# Patient Record
Sex: Female | Born: 1960 | Race: White | Hispanic: No | Marital: Married | Smoking: Former smoker
Health system: Southern US, Community
[De-identification: ages and names within clinical notes are randomized; demographics above are authoritative.]

## PROBLEM LIST (undated history)

## (undated) DIAGNOSIS — J432 Centrilobular emphysema: Secondary | ICD-10-CM

## (undated) DIAGNOSIS — R519 Headache, unspecified: Secondary | ICD-10-CM

## (undated) DIAGNOSIS — Z803 Family history of malignant neoplasm of breast: Secondary | ICD-10-CM

## (undated) DIAGNOSIS — J439 Emphysema, unspecified: Secondary | ICD-10-CM

## (undated) DIAGNOSIS — J45909 Unspecified asthma, uncomplicated: Secondary | ICD-10-CM

## (undated) DIAGNOSIS — M1990 Unspecified osteoarthritis, unspecified site: Secondary | ICD-10-CM

## (undated) DIAGNOSIS — G8929 Other chronic pain: Secondary | ICD-10-CM

## (undated) DIAGNOSIS — H539 Unspecified visual disturbance: Secondary | ICD-10-CM

## (undated) DIAGNOSIS — Z972 Presence of dental prosthetic device (complete) (partial): Secondary | ICD-10-CM

## (undated) DIAGNOSIS — N3946 Mixed incontinence: Secondary | ICD-10-CM

## (undated) DIAGNOSIS — Z8709 Personal history of other diseases of the respiratory system: Secondary | ICD-10-CM

## (undated) DIAGNOSIS — Z973 Presence of spectacles and contact lenses: Secondary | ICD-10-CM

## (undated) DIAGNOSIS — E785 Hyperlipidemia, unspecified: Secondary | ICD-10-CM

## (undated) DIAGNOSIS — J939 Pneumothorax, unspecified: Secondary | ICD-10-CM

## (undated) DIAGNOSIS — N393 Stress incontinence (female) (male): Secondary | ICD-10-CM

## (undated) HISTORY — DX: Unspecified osteoarthritis, unspecified site: M19.90

## (undated) HISTORY — DX: Mixed incontinence: N39.46

## (undated) HISTORY — DX: Unspecified visual disturbance: H53.9

## (undated) HISTORY — PX: ABDOMINAL HYSTERECTOMY: SHX81

## (undated) HISTORY — DX: Unspecified asthma, uncomplicated: J45.909

## (undated) HISTORY — PX: OTHER SURGICAL HISTORY: SHX169

## (undated) HISTORY — PX: LUNG SURGERY: SHX703

---

## 1986-09-16 DIAGNOSIS — Z8709 Personal history of other diseases of the respiratory system: Secondary | ICD-10-CM

## 1986-09-16 HISTORY — DX: Personal history of other diseases of the respiratory system: Z87.09

## 1998-05-02 ENCOUNTER — Ambulatory Visit (HOSPITAL_COMMUNITY): Admission: RE | Admit: 1998-05-02 | Discharge: 1998-05-02 | Payer: Self-pay

## 1999-09-17 HISTORY — PX: LAPAROSCOPIC ASSISTED VAGINAL HYSTERECTOMY: SHX5398

## 2004-05-21 ENCOUNTER — Other Ambulatory Visit: Payer: Self-pay

## 2004-07-12 ENCOUNTER — Emergency Department: Payer: Self-pay | Admitting: Internal Medicine

## 2006-02-17 ENCOUNTER — Emergency Department: Payer: Self-pay | Admitting: Emergency Medicine

## 2007-02-02 ENCOUNTER — Other Ambulatory Visit: Payer: Self-pay

## 2007-02-02 ENCOUNTER — Emergency Department: Payer: Self-pay | Admitting: Emergency Medicine

## 2009-10-26 ENCOUNTER — Ambulatory Visit: Payer: Self-pay | Admitting: Internal Medicine

## 2009-10-31 ENCOUNTER — Emergency Department: Payer: Self-pay | Admitting: Emergency Medicine

## 2010-05-14 ENCOUNTER — Observation Stay: Payer: Self-pay | Admitting: Internal Medicine

## 2011-03-25 ENCOUNTER — Inpatient Hospital Stay: Payer: Self-pay | Admitting: Internal Medicine

## 2011-09-17 HISTORY — PX: ESOPHAGOGASTRODUODENOSCOPY: SHX1529

## 2012-06-30 ENCOUNTER — Ambulatory Visit: Payer: Self-pay | Admitting: Internal Medicine

## 2012-07-14 ENCOUNTER — Ambulatory Visit: Payer: Self-pay | Admitting: General Surgery

## 2012-07-15 LAB — PATHOLOGY REPORT

## 2012-07-17 ENCOUNTER — Ambulatory Visit: Payer: Self-pay | Admitting: General Surgery

## 2012-08-11 ENCOUNTER — Ambulatory Visit: Payer: Self-pay | Admitting: Gastroenterology

## 2015-05-22 ENCOUNTER — Encounter: Payer: Self-pay | Admitting: Emergency Medicine

## 2015-05-22 ENCOUNTER — Emergency Department: Payer: BLUE CROSS/BLUE SHIELD

## 2015-05-22 ENCOUNTER — Emergency Department
Admission: EM | Admit: 2015-05-22 | Discharge: 2015-05-22 | Disposition: A | Discharge status: Home / Self Care | Payer: BLUE CROSS/BLUE SHIELD | Attending: Emergency Medicine | Admitting: Emergency Medicine

## 2015-05-22 DIAGNOSIS — R0789 Other chest pain: Secondary | ICD-10-CM | POA: Insufficient documentation

## 2015-05-22 DIAGNOSIS — R0602 Shortness of breath: Secondary | ICD-10-CM | POA: Diagnosis present

## 2015-05-22 DIAGNOSIS — R079 Chest pain, unspecified: Secondary | ICD-10-CM

## 2015-05-22 HISTORY — DX: Pneumothorax, unspecified: J93.9

## 2015-05-22 HISTORY — DX: Emphysema, unspecified: J43.9

## 2015-05-22 LAB — CBC WITH DIFFERENTIAL/PLATELET
BASOS ABS: 0 10*3/uL (ref 0–0.1)
Basophils Relative: 1 %
Eosinophils Absolute: 0.2 10*3/uL (ref 0–0.7)
Eosinophils Relative: 2 %
HEMATOCRIT: 40.5 % (ref 35.0–47.0)
Hemoglobin: 13.7 g/dL (ref 12.0–16.0)
Lymphocytes Relative: 22 %
Lymphs Abs: 1.6 10*3/uL (ref 1.0–3.6)
MCH: 29.8 pg (ref 26.0–34.0)
MCHC: 34 g/dL (ref 32.0–36.0)
MCV: 87.8 fL (ref 80.0–100.0)
Monocytes Absolute: 0.5 10*3/uL (ref 0.2–0.9)
Monocytes Relative: 7 %
NEUTROS ABS: 5 10*3/uL (ref 1.4–6.5)
NEUTROS PCT: 68 %
Platelets: 258 10*3/uL (ref 150–440)
RBC: 4.61 MIL/uL (ref 3.80–5.20)
RDW: 12.8 % (ref 11.5–14.5)
WBC: 7.3 10*3/uL (ref 3.6–11.0)

## 2015-05-22 LAB — COMPREHENSIVE METABOLIC PANEL
ALK PHOS: 39 U/L (ref 38–126)
ALT: 13 U/L — ABNORMAL LOW (ref 14–54)
AST: 16 U/L (ref 15–41)
Albumin: 4.1 g/dL (ref 3.5–5.0)
Anion gap: 8 (ref 5–15)
BILIRUBIN TOTAL: 0.9 mg/dL (ref 0.3–1.2)
BUN: 18 mg/dL (ref 6–20)
CALCIUM: 9.4 mg/dL (ref 8.9–10.3)
CO2: 23 mmol/L (ref 22–32)
Chloride: 106 mmol/L (ref 101–111)
Creatinine, Ser: 0.9 mg/dL (ref 0.44–1.00)
GFR calc Af Amer: >60 mL/min (ref >60)
GFR calc non Af Amer: >60 mL/min (ref >60)
Glucose, Bld: 109 mg/dL — ABNORMAL HIGH (ref 65–99)
POTASSIUM: 4 mmol/L (ref 3.5–5.1)
SODIUM: 137 mmol/L (ref 135–145)
TOTAL PROTEIN: 7.3 g/dL (ref 6.5–8.1)

## 2015-05-22 LAB — TROPONIN I

## 2015-05-22 MED ORDER — KETOROLAC TROMETHAMINE 30 MG/ML IJ SOLN
30.0000 mg | Freq: Once | INTRAMUSCULAR | Status: AC
  Administered 2015-05-22: 30 mg via INTRAVENOUS
  Filled 2015-05-22: qty 1

## 2015-05-22 MED ORDER — LORAZEPAM 1 MG PO TABS: 1.0000 mg | ORAL_TABLET | Freq: Three times a day (TID) | ORAL | Status: AC | PRN

## 2015-05-22 MED ORDER — HYDROMORPHONE HCL 2 MG PO TABS: 2.0000 mg | ORAL_TABLET | Freq: Four times a day (QID) | ORAL | Status: AC | PRN

## 2015-05-22 MED ORDER — IOHEXOL 350 MG/ML SOLN
100.0000 mL | Freq: Once | INTRAVENOUS | Status: AC | PRN
  Administered 2015-05-22: 100 mL via INTRAVENOUS

## 2015-05-22 NOTE — ED Notes (Signed)
Reports sob and lightheaded.  States she has blebs.  No resp distress at this time.

## 2015-05-22 NOTE — Discharge Instructions (Signed)
Chest Pain (Nonspecific) °It is often hard to give a specific diagnosis for the cause of chest pain. There is always a chance that your pain could be related to something serious, such as a heart attack or a blood clot in the lungs. You need to follow up with your health care provider for further evaluation. °CAUSES  °· Heartburn. °· Pneumonia or bronchitis. °· Anxiety or stress. °· Inflammation around your heart (pericarditis) or lung (pleuritis or pleurisy). °· A blood clot in the lung. °· A collapsed lung (pneumothorax). It can develop suddenly on its own (spontaneous pneumothorax) or from trauma to the chest. °· Shingles infection (herpes zoster virus). °The chest wall is composed of bones, muscles, and cartilage. Any of these can be the source of the pain. °· The bones can be bruised by injury. °· The muscles or cartilage can be strained by coughing or overwork. °· The cartilage can be affected by inflammation and become sore (costochondritis). °DIAGNOSIS  °Lab tests or other studies may be needed to find the cause of your pain. Your health care provider may have you take a test called an ambulatory electrocardiogram (ECG). An ECG records your heartbeat patterns over a 24-hour period. You may also have other tests, such as: °· Transthoracic echocardiogram (TTE). During echocardiography, sound waves are used to evaluate how blood flows through your heart. °· Transesophageal echocardiogram (TEE). °· Cardiac monitoring. This allows your health care provider to monitor your heart rate and rhythm in real time. °· Holter monitor. This is a portable device that records your heartbeat and can help diagnose heart arrhythmias. It allows your health care provider to track your heart activity for several days, if needed. °· Stress tests by exercise or by giving medicine that makes the heart beat faster. °TREATMENT  °· Treatment depends on what may be causing your chest pain. Treatment may include: °· Acid blockers for  heartburn. °· Anti-inflammatory medicine. °· Pain medicine for inflammatory conditions. °· Antibiotics if an infection is present. °· You may be advised to change lifestyle habits. This includes stopping smoking and avoiding alcohol, caffeine, and chocolate. °· You may be advised to keep your head raised (elevated) when sleeping. This reduces the chance of acid going backward from your stomach into your esophagus. °Most of the time, nonspecific chest pain will improve within 2-3 days with rest and mild pain medicine.  °HOME CARE INSTRUCTIONS  °· If antibiotics were prescribed, take them as directed. Finish them even if you start to feel better. °· For the next few days, avoid physical activities that bring on chest pain. Continue physical activities as directed. °· Do not use any tobacco products, including cigarettes, chewing tobacco, or electronic cigarettes. °· Avoid drinking alcohol. °· Only take medicine as directed by your health care provider. °· Follow your health care provider's suggestions for further testing if your chest pain does not go away. °· Keep any follow-up appointments you made. If you do not go to an appointment, you could develop lasting (chronic) problems with pain. If there is any problem keeping an appointment, call to reschedule. °SEEK MEDICAL CARE IF:  °· Your chest pain does not go away, even after treatment. °· You have a rash with blisters on your chest. °· You have a fever. °SEEK IMMEDIATE MEDICAL CARE IF:  °· You have increased chest pain or pain that spreads to your arm, neck, jaw, back, or abdomen. °· You have shortness of breath. °· You have an increasing cough, or you cough   up blood.  You have severe back or abdominal pain.  You feel nauseous or vomit.  You have severe weakness.  You faint.  You have chills. This is an emergency. Do not wait to see if the pain will go away. Get medical help at once. Call your local emergency services (911 in [place] Do not drive  yourself to the hospital. MAKE SURE YOU:   Understand these instructions.  Will watch your condition.  Will get help right away if you are not doing well or get worse. Document Released: 06/12/2005 Document Revised: 09/07/2013 Document Reviewed: 04/07/2008 [facility] Patient Information 2015 [place], [place]. This information is not intended to replace advice given to you by your health care provider. Make sure you discuss any questions you have with your health care provider.  Chest Wall Pain Chest wall pain is pain in or around the bones and muscles of your chest. It may take up to 6 weeks to get better. It may take longer if you must stay physically active in your work and activities.  CAUSES  Chest wall pain may happen on its own. However, it may be caused by:  A viral illness like the flu.  Injury.  Coughing.  Exercise.  Arthritis.  Fibromyalgia.  Shingles. HOME CARE INSTRUCTIONS   Avoid overtiring physical activity. Try not to strain or perform activities that cause pain. This includes any activities using your chest or your abdominal and side muscles, especially if heavy weights are used.  Put ice on the sore area.  Put ice in a plastic bag.  Place a towel between your skin and the bag.  Leave the ice on for 15-20 minutes per hour while awake for the first 2 days.  Only take over-the-counter or prescription medicines for pain, discomfort, or fever as directed by your caregiver. SEEK IMMEDIATE MEDICAL CARE IF:   Your pain increases, or you are very uncomfortable.  You have a fever.  Your chest pain becomes worse.  You have new, unexplained symptoms.  You have nausea or vomiting.  You feel sweaty or lightheaded.  You have a cough with phlegm (sputum), or you cough up blood. MAKE SURE YOU:   Understand these instructions.  Will watch your condition.  Will get help right away if you are not doing well or get worse. Document Released: 09/02/2005 Document  Revised: 11/25/2011 Document Reviewed: 04/29/2011 [facility] Patient Information 2015 [place], [place]. This information is not intended to replace advice given to you by your health care provider. Make sure you discuss any questions you have with your health care provider.  Chest Wall Pain Chest wall pain is pain in or around the bones and muscles of your chest. It may take up to 6 weeks to get better. It may take longer if you must stay physically active in your work and activities.  CAUSES  Chest wall pain may happen on its own. However, it may be caused by:  A viral illness like the flu.  Injury.  Coughing.  Exercise.  Arthritis.  Fibromyalgia.  Shingles. HOME CARE INSTRUCTIONS   Avoid overtiring physical activity. Try not to strain or perform activities that cause pain. This includes any activities using your chest or your abdominal and side muscles, especially if heavy weights are used.  Put ice on the sore area.  Put ice in a plastic bag.  Place a towel between your skin and the bag.  Leave the ice on for 15-20 minutes per hour while awake for the first 2  days.  Only take over-the-counter or prescription medicines for pain, discomfort, or fever as directed by your caregiver. SEEK IMMEDIATE MEDICAL CARE IF:   Your pain increases, or you are very uncomfortable.  You have a fever.  Your chest pain becomes worse.  You have new, unexplained symptoms.  You have nausea or vomiting.  You feel sweaty or lightheaded.  You have a cough with phlegm (sputum), or you cough up blood. MAKE SURE YOU:   Understand these instructions.  Will watch your condition.  Will get help right away if you are not doing well or get worse. Document Released: 09/02/2005 Document Revised: 11/25/2011 Document Reviewed: 04/29/2011 [facility] Patient Information 2015 [place], [place]. This information is not intended to replace advice given to you by your health care provider. Make sure you  discuss any questions you have with your health care provider.  Please return immediately if condition worsens. Please contact her primary physician or the physician you were given for referral. If you have any specialist physicians involved in her treatment and plan please also contact them. Thank you for using [facility] [facility]. Please call your primary physician in the morning for follow-up as soon as possible

## 2015-05-22 NOTE — ED Notes (Signed)
Pt remains in waiting room where RN can see pt.  No resp distress.  Xray here to take pt for scan.

## 2015-05-22 NOTE — ED Notes (Signed)
Pt informed to return if any life threatening symptoms occur.  

## 2015-05-22 NOTE — ED Provider Notes (Signed)
Time Seen: ----------------------------------------- 8:50 AM on 05/22/2015 -----------------------------------------   I have reviewed the triage notes  Chief Complaint: Shortness of Breath   History of Present Illness: Cassidy Suarez is a 54 y.o. female who presents with left-sided chest discomfort. She states her pain is worse with movement and has difficulty turning to the left. Patient states her pain started on Friday and seemed to be better on Saturday but returned on Sunday. She states is relatively constant she's having a hard time finding a position of comfort. She describes some mild shortness of breath initially especially on Friday when the pain was somewhat more pleuritic in nature. She states she had an adjustment by her chiropractor but otherwise cannot identify any trauma. Patient denies any persistent nausea or vomiting. She denies any leg pain or swelling. She denies any radiation to the arm or jaw area though she states she has had some left-sided neck discomfort. Eyes any focal weakness in either upper or lower extremities. She denies any pulmonary emboli risk factors. She states she's taken 3 Vicodin at home without relief and also has some muscle relaxant medication at home which is also not offered any relief. Past Medical History  Diagnosis Date  . Lung blebs   . Pneumothorax     There are no active problems to display for this patient.   Past Surgical History  Procedure Laterality Date  . C sections      Past Surgical History  Procedure Laterality Date  . C sections      No current outpatient prescriptions on file.  Allergies:  Review of patient's allergies indicates no known allergies.  Family History: History reviewed. No pertinent family history.  Social History: Social History  Substance Use Topics  . Smoking status: Never Smoker   . Smokeless tobacco: None  . Alcohol Use: Yes     Review of Systems:   10 point review of systems was  performed and was otherwise negative:  Constitutional: No fever Eyes: No visual disturbances ENT: No sore throat, ear pain Cardiac: No chest pain Respiratory: No shortness of breath, wheezing, or stridor Abdomen: No abdominal pain, no vomiting, No diarrhea Endocrine: No weight loss, No night sweats Extremities: No peripheral edema, cyanosis Skin: No rashes, easy bruising Neurologic: No focal weakness, trouble with speech or swollowing Urologic: No dysuria, Hematuria, or urinary frequency   Physical Exam:  ED Triage Vitals  Enc Vitals Group     BP 05/22/15 0727 111/72 mmHg     Pulse Rate 05/22/15 0727 78     Resp 05/22/15 0727 20     Temp 05/22/15 0727 98.7 F (37.1 C)     Temp Source 05/22/15 0727 Oral     SpO2 05/22/15 0727 99 %     Weight 05/22/15 0722 152 lb (68.947 kg)     Height 05/22/15 0722  (1.702 m)     Head Cir --      Peak Flow --      Pain Score 05/22/15 0722 0     Pain Loc --      Pain Edu? --      Excl. in GC? --     General: Awake , Alert , and Oriented times 3; GCS 15 Head: Normal cephalic , atraumatic Eyes: Pupils equal , round, reactive to light Nose/Throat: No nasal drainage, patent upper airway without erythema or exudate.  Neck: Supple, Full range of motion, No anterior adenopathy or palpable thyroid masses Lungs: Clear to ascultation  without wheezes , rhonchi, or rales Heart: Regular rate, regular rhythm without murmurs , gallops , or rubs Abdomen: Soft, non tender without rebound, guarding , or rigidity; bowel sounds positive and symmetric in all 4 quadrants. No organomegaly .        Extremities: 2 plus symmetric pulses. No edema, clubbing or cyanosis Neurologic: normal ambulation, Motor symmetric without deficits, sensory intact Skin: warm, dry, no rashes Patient has reproducible pain with pressing on the left scapular area down through the left chest wall region. Palpation here does reproduce the patient's discomfort.  Labs:   All  laboratory work was reviewed including any pertinent negatives or positives listed below:  Labs Reviewed  COMPREHENSIVE METABOLIC PANEL - Abnormal; Notable for the following:    Glucose, Bld 109 (*)    ALT 13 (*)    All other components within normal limits  CBC WITH DIFFERENTIAL/PLATELET  TROPONIN I    EKG:  ED ECG REPORT I, Jennye Moccasin, the attending physician, personally viewed and interpreted this ECG.  Date: 05/22/2015 EKG Time: 737 Rate: 73 Rhythm: normal sinus rhythm QRS Axis: normal Intervals: normal ST/T Wave abnormalities: normal Conduction Disutrbances: none Narrative Interpretation:  Poor R-wave progression noticed in the anterior leads, no acute ischemic changes are noted  Radiology:   EXAM: CHEST 2 VIEW  COMPARISON: 03/25/2011 and prior chest radiographs  FINDINGS: The cardiomediastinal silhouette is unremarkable.  There is no evidence of focal airspace disease, pulmonary edema, suspicious pulmonary nodule/mass, pleural effusion, or pneumothorax. No acute bony abnormalities are identified.  IMPRESSION: No active cardiopulmonary disease.     EXAM: CT ANGIOGRAPHY CHEST WITH CONTRAST  TECHNIQUE: Multidetector CT imaging of the chest was performed using the standard protocol during bolus administration of intravenous contrast. Multiplanar CT image reconstructions and MIPs were obtained to evaluate the vascular anatomy.  CONTRAST: OMNIPAQUE IOHEXOL 350 MG/ML SOLN  COMPARISON: 05/22/2015 and prior chest radiographs. 05/14/2010 chest CT  FINDINGS: This is a technically satisfactory study.  Mediastinum/Nodes: No pulmonary emboli are identified. The heart and great vessels are unremarkable. There is no evidence of thoracic aortic aneurysm or definite dissection. No pericardial effusion or enlarged lymph nodes identified.  Lungs/Pleura: Moderate centrilobular emphysema identified. There is no evidence of pleural effusion, airspace  disease, consolidation, suspicious nodule, mass or endobronchial/endotracheal lesion.  Upper abdomen: Unremarkable  Musculoskeletal: No acute or suspicious abnormalities identified.  Review of the MIP images confirms the above findings.  IMPRESSION: No evidence of acute abnormality -no evidence of pulmonary emboli.  Moderate emphysema.  I personally reviewed the radiologic studies       ED Course:  Differential includes all life-threatening causes for chest pain. This includes but is not exclusive to acute coronary syndrome, aortic dissection, pulmonary embolism, cardiac tamponade, community-acquired pneumonia, pericarditis, musculoskeletal chest wall pain, etc.  Patient received Toradol here in emergency department with no significant relief. Patient states she needs to drive home and instructed her that I can prescribe her stronger pain medication for home but I cannot give her anything here and have her drive. Her pain seems to be chest wall pain possible muscle spasm versus early shingles given the location patient states she has had recent shaking shingles on the anterior surface of her left leg and this would be rather unusual for her to develop in another location that is closest to her previous case. Patient does not appear to have any signs on objectively for pulmonary embolism or pneumothorax.   Assessment:  Acute unspecified chest pain Chest  wall discomfort  Final Clinical Impression:   Final diagnoses:  Chest pain syndrome     Plan:  Patient was advised to return immediately if condition worsens. Patient was advised to follow up with her primary care physician or other specialized physicians involved and in their current assessment. Patient was given a prescription for Dilaudid and Ativan on an outpatient basis. She was given instructions on the medication.            Jennye Moccasin, MD 05/22/15 [phone]

## 2015-06-05 ENCOUNTER — Ambulatory Visit (INDEPENDENT_AMBULATORY_CARE_PROVIDER_SITE_OTHER): Payer: BLUE CROSS/BLUE SHIELD | Admitting: Podiatry

## 2015-06-05 ENCOUNTER — Encounter: Payer: Self-pay | Admitting: Podiatry

## 2015-06-05 VITALS — BP 105/64 | HR 79 | Resp 12

## 2015-06-05 DIAGNOSIS — L603 Nail dystrophy: Secondary | ICD-10-CM

## 2015-06-05 NOTE — Progress Notes (Signed)
   Subjective:    Patient ID: Cassidy Suarez, female    DOB: December 17, 1960, 54 y.o.   MRN: [id number]  HPI she presents today with a several day duration of discoloration to the fibular portion of the hallux nails bilateral. She denies trauma. States that the discoloration was there when she removed her toenail polish.    Review of Systems  Musculoskeletal: Positive for myalgias.  Skin: Positive for color change.       Objective:   Physical Exam: 54 year old female with a chief complaint of discoloration fibular portion of the hallux nails bilateral distress. Vital signs are stable alert and oriented 3. Pulses are palpable. Neurologic sensorium is intact per Semmes-Weinstein monofilament. Deep tendon reflexes are intact. Muscle strength is 5 over 5 dorsiflexion plantar flexors and inverters everters all intrinsic musculature is intact. Orthopedic evaluation joints distal to the ankle range of motion without crepitation. Cutaneous evaluation demonstrates supple well-hydrated cutis discoloration of the hallux nails which appears to be a subungual hematoma along the lateral aspect of the nail. Extends primarily from the posterior lateral to the distal lateral aspect of the nail plate. I expressed to the patient that this appeared to be blood she denied this emphatically.     Assessment & Plan:  Subungual hematoma bilateral hallux. Remainder of the nail plates are normal. No athlete's foot.  Plan: Discussed etiology pathology conservative versus surgical therapies. Took samples of the nails today sent for pathologic evaluation will follow up with her once the pathology reports returned.

## 2015-06-22 ENCOUNTER — Telehealth: Payer: Self-pay | Admitting: *Deleted

## 2015-06-22 NOTE — Telephone Encounter (Signed)
Called pt to let her know that culture was positive for fungus and appointment was needed to discuss treatment.

## 2015-07-02 ENCOUNTER — Ambulatory Visit
Admission: EM | Admit: 2015-07-02 | Discharge: 2015-07-02 | Disposition: A | Discharge status: Home / Self Care | Payer: BLUE CROSS/BLUE SHIELD | Attending: Family Medicine | Admitting: Family Medicine

## 2015-07-02 ENCOUNTER — Encounter: Payer: Self-pay | Admitting: *Deleted

## 2015-07-02 ENCOUNTER — Ambulatory Visit: Payer: BLUE CROSS/BLUE SHIELD

## 2015-07-02 DIAGNOSIS — S9002XA Contusion of left ankle, initial encounter: Secondary | ICD-10-CM | POA: Diagnosis not present

## 2015-07-02 MED ORDER — MUPIROCIN 2 % EX OINT: 1.0000 "application " | TOPICAL_OINTMENT | Freq: Three times a day (TID) | CUTANEOUS | Status: DC

## 2015-07-02 MED ORDER — HYDROCODONE-ACETAMINOPHEN 5-325 MG PO TABS: 1.0000 | ORAL_TABLET | Freq: Four times a day (QID) | ORAL | Status: DC | PRN

## 2015-07-02 NOTE — ED Provider Notes (Signed)
CSN: [id number]     Arrival date & time 07/02/15  1346 History   First MD Initiated Contact with Patient 07/02/15 1458     Chief Complaint  Patient presents with  . Foot Injury   (Consider location/radiation/quality/duration/timing/severity/associated sxs/prior Treatment) HPI   This a 54 year old female accompanied by her husband was stacking rocks in  a wheelbarrow in her yard today, it became unbalanced and tipped over striking her lateral ankle and foot. She sustained a small abrasion to the lateral distal leg. It is very painful to bear weight having cramps up her entire leg.  Past Medical History  Diagnosis Date  . Lung blebs (HCC)   . Pneumothorax   . Arthritis    Past Surgical History  Procedure Laterality Date  . C sections     Family History  Problem Relation Age of Onset  . Cancer Mother   . Diabetes Mother   . Stroke Mother    Social History  Substance Use Topics  . Smoking status: Never Smoker   . Smokeless tobacco: None  . Alcohol Use: Yes   OB History    No data available     Review of Systems  Constitutional: Positive for activity change. Negative for fever, chills and fatigue.  Skin: Positive for color change and wound.  All other systems reviewed and are negative.   Allergies  Review of patient's allergies indicates no known allergies.  Home Medications   Prior to Admission medications   Medication Sig Start Date End Date Taking? Authorizing Provider  cyclobenzaprine (FLEXERIL) 5 MG tablet  05/16/15   Historical Provider, MD  estradiol-norethindrone (ACTIVELLA) 1-0.5 MG per tablet  05/16/15   Historical Provider, MD  etodolac (LODINE) 200 MG capsule Take 200 mg by mouth every 8 (eight) hours.    Historical Provider, MD  HYDROcodone-acetaminophen (NORCO/VICODIN) 5-325 MG tablet Take 1-2 tablets by mouth every 6 (six) hours as needed for severe pain. 07/02/15   Lutricia FeilWilliam P Lala Been, PA-C  LORazepam (ATIVAN) 1 MG tablet Take 1 tablet (1 mg total) by  mouth every 8 (eight) hours as needed (muscle spasm). 05/22/15 05/28/16  Jennye MoccasinBrian S Quigley, MD  mupirocin ointment (BACTROBAN) 2 % Apply 1 application topically 3 (three) times daily. 07/02/15   Lutricia FeilWilliam P Kemonte Ullman, PA-C   Meds Ordered and Administered this Visit  Medications - No data to display  BP 99/71 mmHg  Pulse 79  Temp(Src) 98 F (36.7 C) (Oral)  Resp 18  Ht 5' 7.5" (1.715 m)  Wt 152 lb (68.947 kg)  BMI 23.44 kg/m2  SpO2 100%  LMP  No data found.   Physical Exam  Constitutional: She is oriented to person, place, and time. She appears well-developed and well-nourished.  HENT:  Head: Normocephalic and atraumatic.  Eyes: Pupils are equal, round, and reactive to light.  Neck: Neck supple.  Musculoskeletal: She exhibits tenderness.  His admission of the left ankle shows small abrasion just superior to the lateral malleolus. Is no swelling ecchymosis or erythema present. Complains of even the lightest touch causing pain. Withdraws her foot with any attempt to palpate. Is fairly good range of motion of her ankle with discomfort at the extremes of flexion extension there is good subtalar motion. Is no pain to palpation of the calcaneus and hindfoot midfoot or forefoot.. Most tenderness is along the distal fibula lateral malleolus. Medial malleolus is uninvolved.  Neurological: She is alert and oriented to person, place, and time.  Skin: Skin is warm and dry.  Psychiatric:  She has a normal mood and affect. Her behavior is normal. Judgment and thought content normal.  Nursing note and vitals reviewed.   ED Course  Procedures (including critical care time)  Labs Review Labs Reviewed - No data to display  Imaging Review Dg Ankle Complete Left  07/02/2015  CLINICAL DATA:  54 year old female with left ankle pain after a barrel full of rocks tipped over and landed on the left ankle. EXAM: LEFT ANKLE COMPLETE - 3+ VIEW COMPARISON:  No priors. FINDINGS: Multiple views of the left ankle  demonstrate no acute displaced fracture, subluxation, dislocation, or soft tissue abnormality. IMPRESSION: No acute radiographic abnormality of the left ankle. Electronically Signed   By: Trudie Reed M.D.   On: 07/02/2015 14:55     Visual Acuity Review  Right Eye Distance:   Left Eye Distance:   Bilateral Distance:    Right Eye Near:   Left Eye Near:    Bilateral Near:     Cam walker fitted. Crutches provided.    MDM   1. Contusion, ankle, left, initial encounter    Discharge Medication List as of 07/02/2015  3:24 PM    START taking these medications   Details  HYDROcodone-acetaminophen (NORCO/VICODIN) 5-325 MG tablet Take 1-2 tablets by mouth every 6 (six) hours as needed for severe pain., Starting 07/02/2015, Until Discontinued, Print    mupirocin ointment (BACTROBAN) 2 % Apply 1 application topically 3 (three) times daily., Starting 07/02/2015, Until Discontinued, Print      Plan: 1. Test/x-ray results and diagnosis reviewed with patient 2. rx as per orders; risks, benefits, potential side effects reviewed with patient 3. Recommend supportive treatment with elevation/ice. Rest,crutches PRN.Wash and apply Bactroban UGT to skin tear TID. 4. F/u prn if symptoms worsen or don't improve     Lutricia Feil, PA-C 07/02/15 [id number]

## 2015-07-02 NOTE — ED Notes (Signed)
Working out in her yard moving rocks in a wheel barrel when the wheel barrel fell on the outer aspect of her left ankle and foot. 10/10 pain, positive pulses, skin tear to outer ankle.  She states she is able to bear weight but is very painful.

## 2015-07-02 NOTE — Discharge Instructions (Signed)

## 2015-07-19 ENCOUNTER — Encounter: Payer: Self-pay | Admitting: Podiatry

## 2015-07-19 ENCOUNTER — Ambulatory Visit (INDEPENDENT_AMBULATORY_CARE_PROVIDER_SITE_OTHER): Payer: BLUE CROSS/BLUE SHIELD | Admitting: Podiatry

## 2015-07-19 VITALS — BP 151/96 | HR 83 | Resp 18

## 2015-07-19 DIAGNOSIS — L603 Nail dystrophy: Secondary | ICD-10-CM | POA: Diagnosis not present

## 2015-07-19 DIAGNOSIS — Z79899 Other long term (current) drug therapy: Secondary | ICD-10-CM | POA: Diagnosis not present

## 2015-07-19 MED ORDER — TERBINAFINE HCL 250 MG PO TABS: 250.0000 mg | ORAL_TABLET | Freq: Every day | ORAL | Status: DC

## 2015-07-19 NOTE — Patient Instructions (Signed)

## 2015-07-20 LAB — CBC WITH DIFFERENTIAL/PLATELET
BASOS: 0 %
Basophils Absolute: 0 10*3/uL (ref 0.0–0.2)
EOS (ABSOLUTE): 0.1 10*3/uL (ref 0.0–0.4)
EOS: 1 %
HEMATOCRIT: 39.6 % (ref 34.0–46.6)
Hemoglobin: 13.7 g/dL (ref 11.1–15.9)
IMMATURE GRANS (ABS): 0 10*3/uL (ref 0.0–0.1)
IMMATURE GRANULOCYTES: 0 %
Lymphocytes Absolute: 1.5 10*3/uL (ref 0.7–3.1)
Lymphs: 21 %
MCH: 30.2 pg (ref 26.6–33.0)
MCHC: 34.6 g/dL (ref 31.5–35.7)
MCV: 87 fL (ref 79–97)
MONOS ABS: 0.6 10*3/uL (ref 0.1–0.9)
Monocytes: 9 %
NEUTROS ABS: 4.9 10*3/uL (ref 1.4–7.0)
Neutrophils: 69 %
PLATELETS: 318 10*3/uL (ref 150–379)
RBC: 4.54 x10E6/uL (ref 3.77–5.28)
RDW: 12.8 % (ref 12.3–15.4)
WBC: 7.1 10*3/uL (ref 3.4–10.8)

## 2015-07-20 NOTE — Progress Notes (Signed)
She presents today for follow-up of her fungal culture from her toenails.  Objective: Fungal culture lab results do demonstrate onychomycosis.  Assessment: Onychomycosis.  Plan: We discussed both oral medication and laser therapy. At this point she would like oral medication. We requested liver profile and CBC. We will start her on terbinafine 250 mg tablets #30 one by mouth daily I will follow-up with her in 1 month. Should her blood work on back abnormal we will notify her immediately. We did discuss the possible side effects of this medication. She was instructed to call should any of these arise.  Arbutus Pedodd Hyatt DPM

## 2015-07-21 LAB — HEPATIC FUNCTION PANEL
ALT: 11 IU/L (ref 0–32)
AST: 11 IU/L (ref 0–40)
Albumin: 4.4 g/dL (ref 3.5–5.5)
Alkaline Phosphatase: 40 IU/L (ref 39–117)
BILIRUBIN, DIRECT: 0.11 mg/dL (ref 0.00–0.40)
Bilirubin Total: 0.5 mg/dL (ref 0.0–1.2)
Total Protein: 6.7 g/dL (ref 6.0–8.5)

## 2015-07-24 ENCOUNTER — Telehealth: Payer: Self-pay | Admitting: *Deleted

## 2015-07-24 NOTE — Telephone Encounter (Addendum)
-----   Message from Elinor ParkinsonMax T Hyatt, [place] sent at 07/24/2015  7:50 AM EST ----- Blood work looks good and may continue medication.  Orders called to pt.

## 2015-08-21 ENCOUNTER — Ambulatory Visit (INDEPENDENT_AMBULATORY_CARE_PROVIDER_SITE_OTHER): Payer: BLUE CROSS/BLUE SHIELD | Admitting: Podiatry

## 2015-08-21 ENCOUNTER — Encounter: Payer: Self-pay | Admitting: Podiatry

## 2015-08-21 VITALS — BP 111/76 | HR 79 | Resp 12

## 2015-08-21 DIAGNOSIS — Z79899 Other long term (current) drug therapy: Secondary | ICD-10-CM

## 2015-08-21 MED ORDER — TERBINAFINE HCL 250 MG PO TABS: 250.0000 mg | ORAL_TABLET | Freq: Every day | ORAL | Status: DC

## 2015-08-21 NOTE — Progress Notes (Signed)
She presents today for follow-up of her onychomycosis. She's been treated with Lamisil and denies any complications taking medication. She denies fever chills nausea vomiting muscle aches pains rashes or itching. No change in the nail plates yet she says.  Objective: Onychomycosis.  Assessment: Onychomycosis.  Plan: Long-term use of Lamisil 250 mg tablets 1 by mouth daily 90 days dispensed today. Also dispensed a prescription for blood work consisting of a liver profile I will follow-up with her should this be abnormal otherwise I will follow up with her in 4 months.

## 2015-08-26 LAB — HEPATIC FUNCTION PANEL
ALT: 12 IU/L (ref 0–32)
AST: 10 IU/L (ref 0–40)
Albumin: 4.4 g/dL (ref 3.5–5.5)
Alkaline Phosphatase: 35 IU/L — ABNORMAL LOW (ref 39–117)
Bilirubin Total: 0.3 mg/dL (ref 0.0–1.2)
Bilirubin, Direct: 0.1 mg/dL (ref 0.00–0.40)
Total Protein: 6.8 g/dL (ref 6.0–8.5)

## 2015-08-28 ENCOUNTER — Telehealth: Payer: Self-pay | Admitting: *Deleted

## 2015-08-28 NOTE — Telephone Encounter (Addendum)
-----   Message from Elinor ParkinsonMax T Hyatt, [place] sent at 08/28/2015  8:10 AM EST ----- Blood work is ok and may continue medication.  Left message results are normal continue medications.

## 2015-12-20 ENCOUNTER — Encounter (INDEPENDENT_AMBULATORY_CARE_PROVIDER_SITE_OTHER): Payer: BLUE CROSS/BLUE SHIELD | Admitting: Podiatry

## 2015-12-20 NOTE — Progress Notes (Signed)
This encounter was created in error - please disregard.

## 2016-11-28 ENCOUNTER — Ambulatory Visit: Payer: BLUE CROSS/BLUE SHIELD | Admitting: Advanced Practice Midwife

## 2016-12-06 ENCOUNTER — Telehealth: Payer: Self-pay

## 2016-12-06 NOTE — Telephone Encounter (Signed)
Pt calling stating she is out of her hormones.  She has sched. appt and would like a refill to get her to appt. [facility]. [phone]

## 2016-12-10 MED ORDER — ESTRADIOL-NORETHINDRONE ACET 1-0.5 MG PO TABS: 1.0000 | ORAL_TABLET | Freq: Every day | ORAL | 0 refills | Status: DC

## 2016-12-10 NOTE — Telephone Encounter (Signed)
Refill sent.

## 2016-12-19 ENCOUNTER — Ambulatory Visit: Payer: BLUE CROSS/BLUE SHIELD | Admitting: Advanced Practice Midwife

## 2017-01-30 ENCOUNTER — Ambulatory Visit: Payer: Self-pay | Admitting: Obstetrics and Gynecology

## 2017-04-03 ENCOUNTER — Other Ambulatory Visit: Payer: Self-pay | Admitting: Obstetrics and Gynecology

## 2017-04-22 ENCOUNTER — Telehealth: Payer: Self-pay

## 2017-04-22 MED ORDER — ESTRADIOL-NORETHINDRONE ACET 1-0.5 MG PO TABS: 1.0000 | ORAL_TABLET | Freq: Every day | ORAL | 1 refills | Status: DC

## 2017-04-22 NOTE — Telephone Encounter (Signed)
Pt called triage to request refills on activella. Per chart last refill given in March with zero additional refills. Verified with patient that she has been taking consistently and pt stated that PCP may have prescribed it for her as courtesy. Pt has canceled the last 3 appts (March, April & May) and has scheduled annual exam for 06/17/17 with [facility]. Advised pt that she has not been seen in 2 years and must come to this appt. Will send refills to last only until appt.

## 2017-06-17 ENCOUNTER — Encounter: Payer: Self-pay | Admitting: Obstetrics and Gynecology

## 2017-06-17 ENCOUNTER — Ambulatory Visit (INDEPENDENT_AMBULATORY_CARE_PROVIDER_SITE_OTHER): Payer: 59 | Admitting: Obstetrics and Gynecology

## 2017-06-17 VITALS — BP 120/70 | HR 82 | Ht 67.0 in | Wt 151.0 lb

## 2017-06-17 DIAGNOSIS — Z1231 Encounter for screening mammogram for malignant neoplasm of breast: Secondary | ICD-10-CM

## 2017-06-17 DIAGNOSIS — Z79890 Hormone replacement therapy (postmenopausal): Secondary | ICD-10-CM | POA: Diagnosis not present

## 2017-06-17 DIAGNOSIS — L02416 Cutaneous abscess of left lower limb: Secondary | ICD-10-CM | POA: Diagnosis not present

## 2017-06-17 DIAGNOSIS — Z803 Family history of malignant neoplasm of breast: Secondary | ICD-10-CM

## 2017-06-17 DIAGNOSIS — Z1239 Encounter for other screening for malignant neoplasm of breast: Secondary | ICD-10-CM

## 2017-06-17 DIAGNOSIS — Z01419 Encounter for gynecological examination (general) (routine) without abnormal findings: Secondary | ICD-10-CM | POA: Diagnosis not present

## 2017-06-17 DIAGNOSIS — Z1151 Encounter for screening for human papillomavirus (HPV): Secondary | ICD-10-CM | POA: Diagnosis not present

## 2017-06-17 DIAGNOSIS — Z124 Encounter for screening for malignant neoplasm of cervix: Secondary | ICD-10-CM | POA: Diagnosis not present

## 2017-06-17 MED ORDER — ESTRADIOL-NORETHINDRONE ACET 1-0.5 MG PO TABS: 1.0000 | ORAL_TABLET | Freq: Every day | ORAL | 12 refills | Status: DC

## 2017-06-17 MED ORDER — DOXYCYCLINE HYCLATE 100 MG PO CAPS: 100.0000 mg | ORAL_CAPSULE | Freq: Two times a day (BID) | ORAL | 0 refills | Status: DC

## 2017-06-17 NOTE — Patient Instructions (Signed)
[facility]: [phone]    

## 2017-06-17 NOTE — Progress Notes (Signed)
PCP: Jaclyn Shaggy, MD   Chief Complaint  Patient presents with  . Gynecologic Exam    HPI:      [name] is a 56 y.o. [phone] who LMP was No LMP recorded. Patient has had a hysterectomy., presents today for her annual examination.  Her menses are absent due to hyst for CPP yrs ago. She does not have intermenstrual bleeding.  She does have vasomotor sx. She uses activella with some sx improvement. No side effects. Wants to cont meds.   Sex activity: single partner, contraception - status post hysterectomy. She does have vaginal dryness, uses lubricants.  Last Pap: February 27, 2013  Results were: no abnormalities /neg HPV DNA.  Hx of STDs: none  Last mammogram: July 13, 2015  Results were: normal--routine follow-up in 12 months There is a FH of breast cancer in her mother and mat aunt, genetic testing not indicated. There is no FH of ovarian cancer. The patient does do self-breast exams.  Colonoscopy: colonoscopy 5 years ago without abnormalities. Pt followed by PCP for colonoscopy sched.   Tobacco use: The patient denies current or previous tobacco use. Alcohol use: social drinker Exercise: moderately active  She does get adequate calcium but not Vitamin D in her diet.  She states she was bitten by a spider on her leg over the wknd while doing yard work. She didn't see the spider but the area on her left leg started as a small "bite" mark and has gotten bigger and redder. It is very painful, tender to touch. Spreading of redness has been stable since yesterday. Pt had nausea yesterday, too.    Past Medical History:  Diagnosis Date  . Arthritis   . Lung blebs (HCC)   . Pneumothorax     Past Surgical History:  Procedure Laterality Date  . ABDOMINAL HYSTERECTOMY    . c sections    . CESAREAN SECTION    . LUNG SURGERY      Family History  Problem Relation Age of Onset  . Diabetes Mother   . Stroke Mother   . Breast cancer Mother 11       triple neg  .  Heart disease Mother   . Hypertension Mother   . Heart disease Father   . Hypertension Father   . Breast cancer Maternal Aunt 64  . Lung cancer Cousin     Social History   Social History  . Marital status: Married    Spouse name: N/A  . Number of children: N/A  . Years of education: N/A   Occupational History  . Not on file.   Social History Main Topics  . Smoking status: Never Smoker  . Smokeless tobacco: Never Used  . Alcohol use Yes  . Drug use: No  . Sexual activity: Yes   Other Topics Concern  . Not on file   Social History Narrative  . No narrative on file    No outpatient prescriptions have been marked as taking for the 06/17/17 encounter (Office Visit) with Jaunita Mikels, Ilona Sorrel, PA-C.      ROS:  Review of Systems  Constitutional: Negative for fatigue, fever and unexpected weight change.  Respiratory: Negative for cough, shortness of breath and wheezing.   Cardiovascular: Negative for chest pain, palpitations and leg swelling.  Gastrointestinal: Negative for blood in stool, constipation, diarrhea, nausea and vomiting.  Endocrine: Negative for cold intolerance, heat intolerance and polyuria.  Genitourinary: Negative for dyspareunia, dysuria, flank pain, frequency, genital  sores, hematuria, menstrual problem, pelvic pain, urgency, vaginal bleeding, vaginal discharge and vaginal pain.  Musculoskeletal: Negative for back pain, joint swelling and myalgias.  Skin: Positive for color change and rash.  Neurological: Negative for dizziness, syncope, light-headedness, numbness and headaches.  Hematological: Negative for adenopathy.  Psychiatric/Behavioral: Negative for agitation, confusion, sleep disturbance and suicidal ideas. The patient is not nervous/anxious.      Objective: BP 120/70   Pulse 82   Ht  (1.702 m)   Wt 151 lb (68.5 kg)   BMI 23.65 kg/m    Physical Exam  Constitutional: She is oriented to person, place, and time. She appears  well-developed and well-nourished.  Genitourinary: Vagina normal. There is no rash or tenderness on the right labia. There is no rash or tenderness on the left labia. No erythema or tenderness in the vagina. No vaginal discharge found. Right adnexum does not display mass and does not display tenderness. Left adnexum does not display mass and does not display tenderness.  Genitourinary Comments: UTERUS/CX SURG REM  Neck: Normal range of motion. No thyromegaly present.  Cardiovascular: Normal rate, regular rhythm and normal heart sounds.   No murmur heard. Pulmonary/Chest: Effort normal and breath sounds normal. Right breast exhibits no mass, no nipple discharge, no skin change and no tenderness. Left breast exhibits no mass, no nipple discharge, no skin change and no tenderness.  Abdominal: Soft. There is no tenderness. There is no guarding.  Musculoskeletal: Normal range of motion.  Neurological: She is alert and oriented to person, place, and time. No cranial nerve deficit.  Skin: Lesion noted. There is erythema.     LT UPPER THIGH WITH ~5 INCH AREA OF TENDER ERYTHEMA WITH RUBOR, MOSTLY TENDER AT CENTER SITE OF "BITE"; NO D/C, NO ULCERS  Psychiatric: She has a normal mood and affect. Her behavior is normal.  Vitals reviewed.   Assessment/Plan:  Encounter for annual routine gynecological examination  Screening for breast cancer - Pt to sched mammo. REcommended 3D at [facility] due to FH breast cancer. - Plan: MM SCREENING BREAST TOMO BILATERAL  Family history of breast cancer - Doesn't qualify for cancer genetic testing since mom >41 with age of triple neg breast cancer. Will cont to follow FH. - Plan: MM SCREENING BREAST TOMO BILATERAL  Cervical cancer screening - Plan: IGP, Aptima HPV  Screening for HPV (human papillomavirus) - Plan: IGP, Aptima HPV  Hormone replacement therapy (HRT) - Rx RF activella. F/u prn.  - Plan: estradiol-norethindrone (ACTIVELLA) 1-0.5 MG tablet  Cutaneous  abscess of left lower extremity - Question bite vs abscess. Rx doxy. Warm and cold compresses. F/u prn.  - Plan: doxycycline (VIBRAMYCIN) 100 MG capsule   Meds ordered this encounter  Medications  . doxycycline (VIBRAMYCIN) 100 MG capsule    Sig: Take 1 capsule (100 mg total) by mouth 2 (two) times daily.    Dispense:  14 capsule    Refill:  0  . estradiol-norethindrone (ACTIVELLA) 1-0.5 MG tablet    Sig: Take 1 tablet by mouth daily.    Dispense:  30 tablet    Refill:  12            GYN counsel breast self exam, mammography screening, use and side effects of HRT, menopause, adequate intake of calcium and vitamin D, diet and exercise    F/U  Return in about 1 year (around 06/17/2018).  Regnia Mathwig B. Mcclain Shall, PA-C 06/17/2017 9:37 AM

## 2017-06-19 LAB — IGP, APTIMA HPV
HPV Aptima: NEGATIVE
PAP Smear Comment: 0

## 2017-08-13 IMAGING — CT CT ANGIO CHEST
1 of 2 series · 18 of 30 positions shown · IV contrast (APPLIED)
Comparison: 05/22/2015 and prior chest radiographs. 05/14/2010
chest CT

CLINICAL DATA: 54-year-old female with shortness of breath for 4
days and cough for 2 days.

EXAM:
CT ANGIOGRAPHY CHEST WITH CONTRAST
TECHNIQUE: Multidetector CT imaging of the chest was performed using the
standard protocol during bolus administration of intravenous
contrast. Multiplanar CT image reconstructions and MIPs were
obtained to evaluate the vascular anatomy.
CONTRAST:  100mL OMNIPAQUE IOHEXOL 350 MG/ML SOLN

[Series 5: pe 1.0 thins · axial · 0.60mm/px · z∈[-189,+68]mm · 18 of 291 slices shown]
[im 17/291  lung]
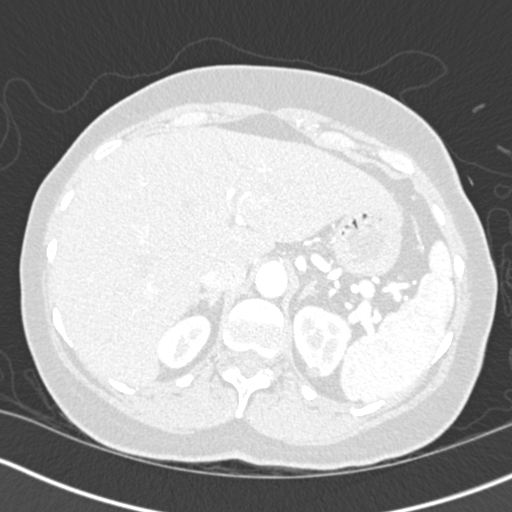
[im 33/291  mediastinal]
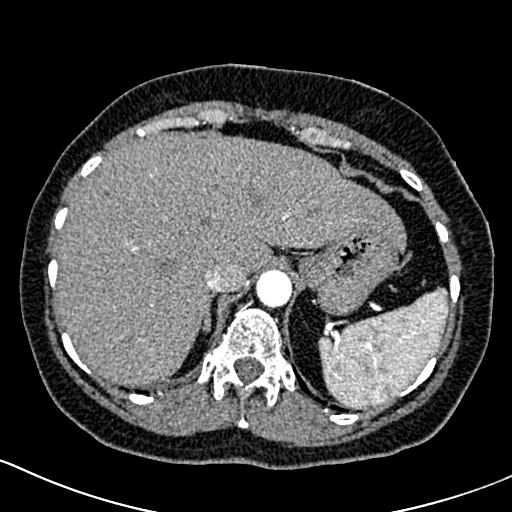
[im 49/291  lung]
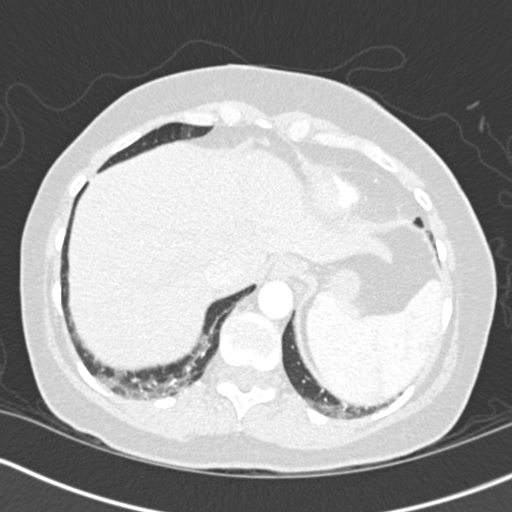
[im 65/291  mediastinal]
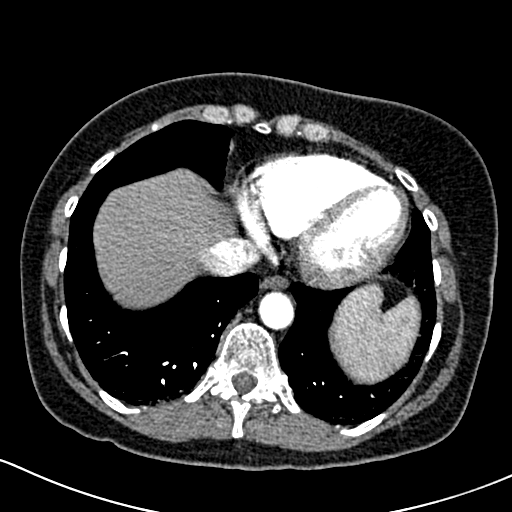
[im 81/291  lung]
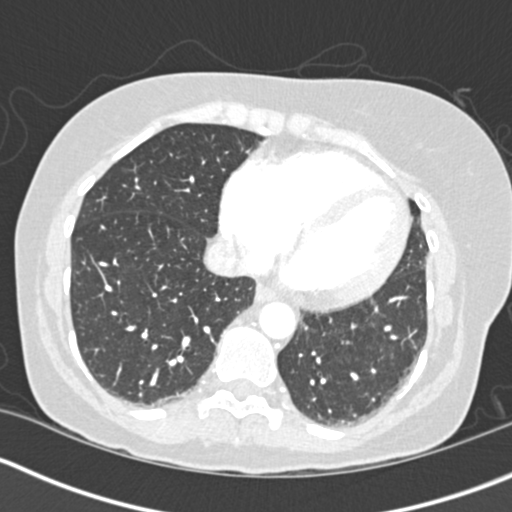
[im 97/291  mediastinal]
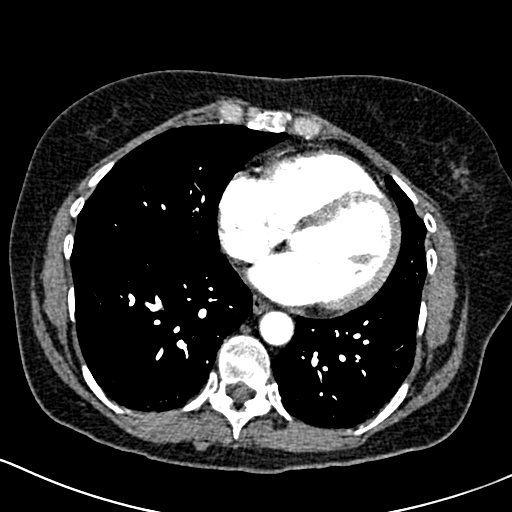
[im 113/291  lung]
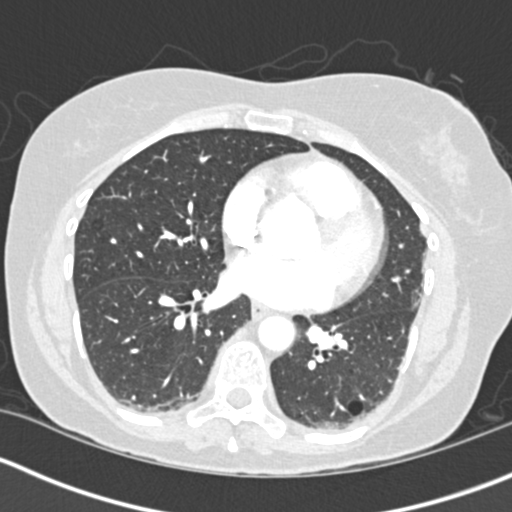
[im 129/291  mediastinal]
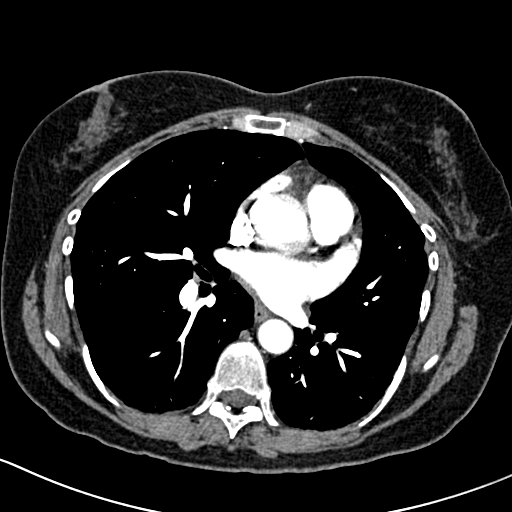
[im 136/291  lung]
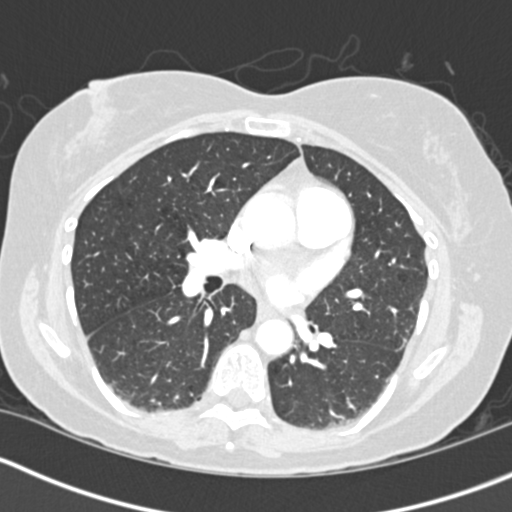
[im 146/291  mediastinal]
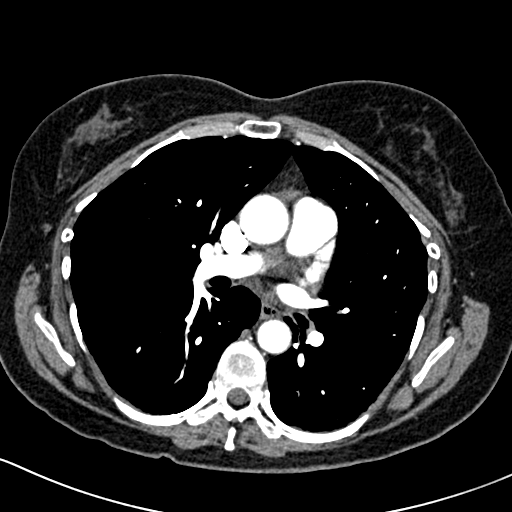
[im 162/291  lung]
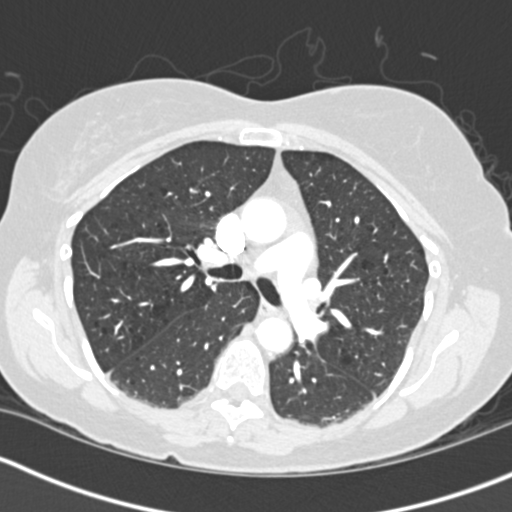
[im 178/291  mediastinal]
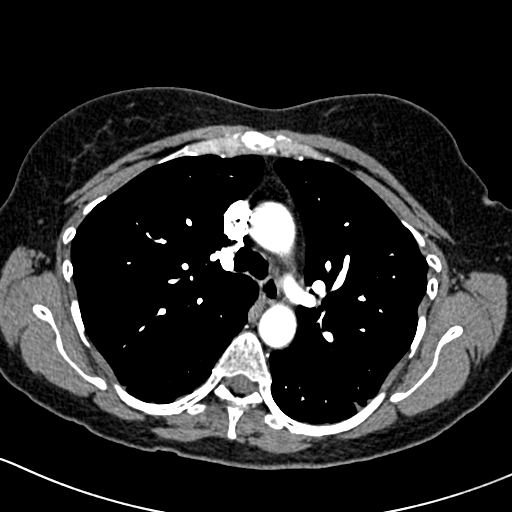
[im 194/291  lung]
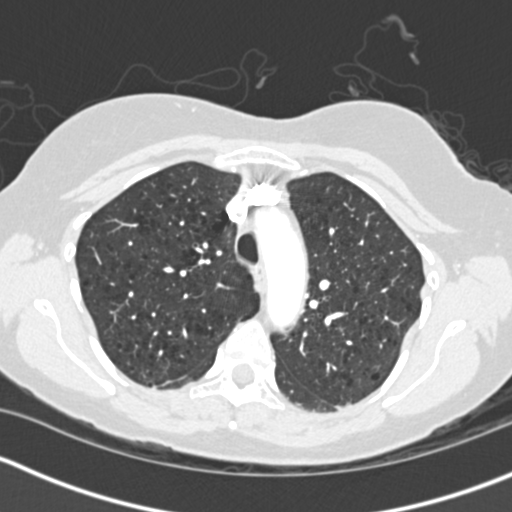
[im 210/291  mediastinal]
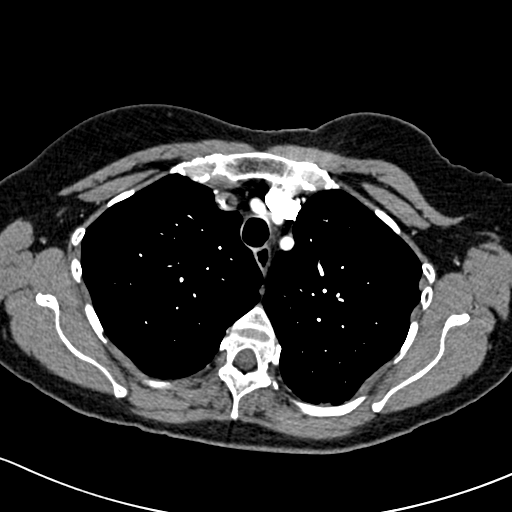
[im 226/291  lung]
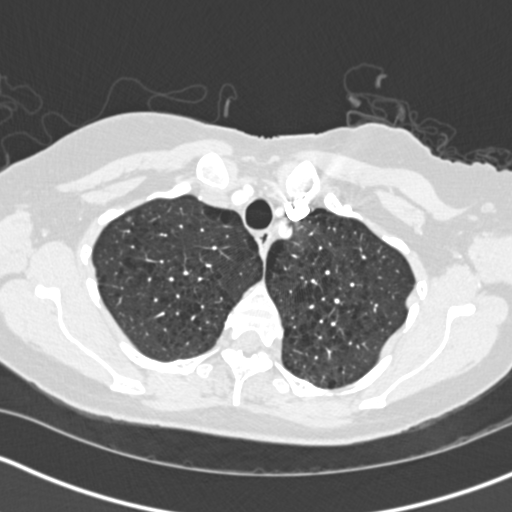
[im 242/291  mediastinal]
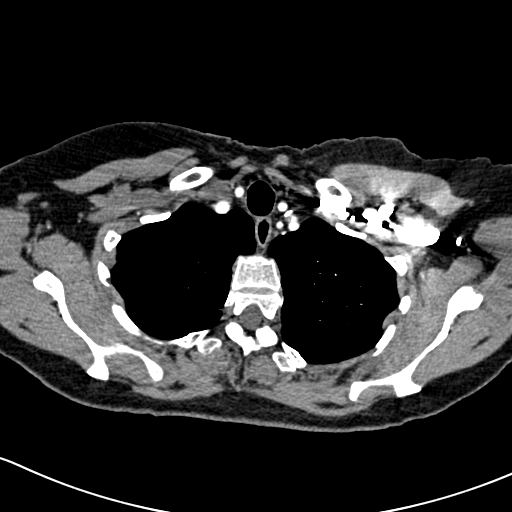
[im 258/291  lung]
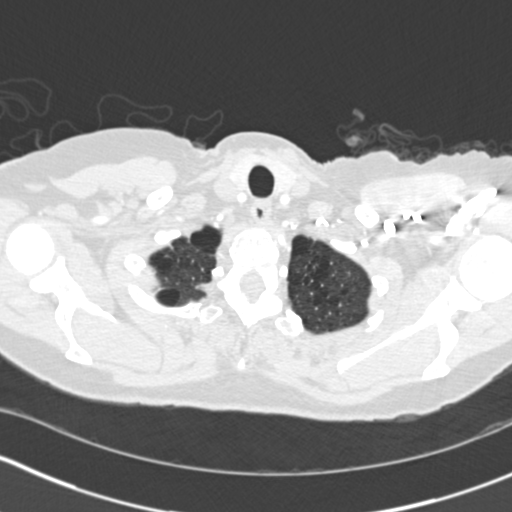
[im 274/291  mediastinal]
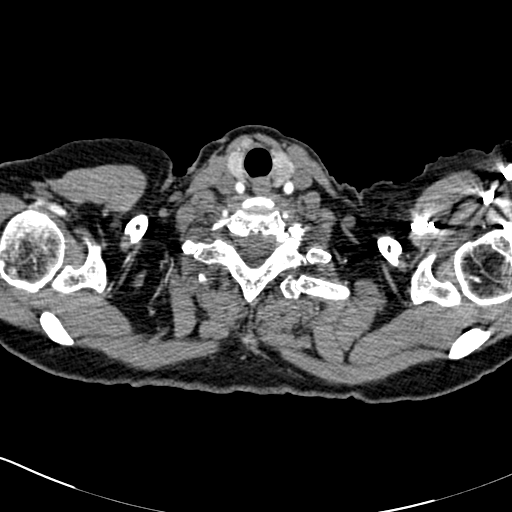

[18 of 30 positions shown; findings below may reference images not displayed]

FINDINGS: This is a technically satisfactory study.

Mediastinum/Nodes: No pulmonary emboli are identified. The heart and
great vessels are unremarkable. There is no evidence of thoracic
aortic aneurysm or definite dissection. No pericardial effusion or
enlarged lymph nodes identified.

Lungs/Pleura: Moderate centrilobular emphysema identified. There is
no evidence of pleural effusion, airspace disease, consolidation,
suspicious nodule, mass or endobronchial/endotracheal lesion.

Upper abdomen: Unremarkable

Musculoskeletal: No acute or suspicious abnormalities identified.

Review of the MIP images confirms the above findings.
IMPRESSION: No evidence of acute abnormality -no evidence of pulmonary emboli.

Moderate emphysema.

## 2018-06-22 ENCOUNTER — Other Ambulatory Visit: Payer: Self-pay | Admitting: Obstetrics and Gynecology

## 2018-06-22 DIAGNOSIS — Z1231 Encounter for screening mammogram for malignant neoplasm of breast: Secondary | ICD-10-CM

## 2018-06-24 ENCOUNTER — Inpatient Hospital Stay: Admission: RE | Admit: 2018-06-24 | Payer: 59 | Source: Ambulatory Visit

## 2018-06-29 ENCOUNTER — Other Ambulatory Visit: Payer: Self-pay | Admitting: Obstetrics and Gynecology

## 2018-06-29 DIAGNOSIS — Z79890 Hormone replacement therapy (postmenopausal): Secondary | ICD-10-CM

## 2018-06-29 NOTE — Telephone Encounter (Signed)
Please advise 

## 2018-07-01 ENCOUNTER — Other Ambulatory Visit: Payer: Self-pay | Admitting: Obstetrics and Gynecology

## 2018-07-01 ENCOUNTER — Telehealth: Payer: Self-pay

## 2018-07-01 DIAGNOSIS — Z79890 Hormone replacement therapy (postmenopausal): Secondary | ICD-10-CM

## 2018-07-01 MED ORDER — ESTRADIOL-NORETHINDRONE ACET 1-0.5 MG PO TABS: 1.0000 | ORAL_TABLET | Freq: Every day | ORAL | 0 refills | Status: DC

## 2018-07-01 NOTE — Progress Notes (Signed)
Rx RF till annual 

## 2018-07-01 NOTE — Telephone Encounter (Signed)
Please advise 

## 2018-07-01 NOTE — Telephone Encounter (Signed)
Rx eRxd till annual.

## 2018-07-01 NOTE — Telephone Encounter (Signed)
Pt calling requesting a refill on Activella to last her to appt 11/4. Careers adviser)

## 2018-07-20 ENCOUNTER — Ambulatory Visit: Payer: 59 | Admitting: Obstetrics and Gynecology

## 2018-07-28 ENCOUNTER — Other Ambulatory Visit: Payer: Self-pay | Admitting: Obstetrics and Gynecology

## 2018-07-28 DIAGNOSIS — Z79890 Hormone replacement therapy (postmenopausal): Secondary | ICD-10-CM

## 2018-08-24 ENCOUNTER — Other Ambulatory Visit: Payer: Self-pay | Admitting: Obstetrics and Gynecology

## 2018-08-24 DIAGNOSIS — Z79890 Hormone replacement therapy (postmenopausal): Secondary | ICD-10-CM

## 2018-09-02 ENCOUNTER — Other Ambulatory Visit: Payer: Self-pay

## 2018-09-02 DIAGNOSIS — Z79890 Hormone replacement therapy (postmenopausal): Secondary | ICD-10-CM

## 2018-09-02 MED ORDER — ESTRADIOL-NORETHINDRONE ACET 1-0.5 MG PO TABS: 1.0000 | ORAL_TABLET | Freq: Every day | ORAL | 0 refills | Status: DC

## 2018-09-02 NOTE — Telephone Encounter (Signed)
Pt apologizes for missing appt.  Her Dad was in the hospital.  [facility]eds [facility] [place].  Appt is scheduled for Mon.  [phone]  Pt aware refill eRx'd.

## 2018-09-07 ENCOUNTER — Encounter: Payer: Self-pay | Admitting: Obstetrics and Gynecology

## 2018-09-07 ENCOUNTER — Ambulatory Visit (INDEPENDENT_AMBULATORY_CARE_PROVIDER_SITE_OTHER): Payer: BLUE CROSS/BLUE SHIELD | Admitting: Obstetrics and Gynecology

## 2018-09-07 VITALS — BP 104/70 | HR 75 | Ht 67.0 in | Wt 148.0 lb

## 2018-09-07 DIAGNOSIS — Z803 Family history of malignant neoplasm of breast: Secondary | ICD-10-CM

## 2018-09-07 DIAGNOSIS — Z79890 Hormone replacement therapy (postmenopausal): Secondary | ICD-10-CM

## 2018-09-07 DIAGNOSIS — Z1239 Encounter for other screening for malignant neoplasm of breast: Secondary | ICD-10-CM

## 2018-09-07 DIAGNOSIS — Z01419 Encounter for gynecological examination (general) (routine) without abnormal findings: Secondary | ICD-10-CM

## 2018-09-07 MED ORDER — ESTRADIOL-NORETHINDRONE ACET 1-0.5 MG PO TABS: 1.0000 | ORAL_TABLET | Freq: Every day | ORAL | 3 refills | Status: DC

## 2018-09-07 NOTE — Progress Notes (Signed)
PCP: Jaclyn Shaggy, MD   Chief Complaint  Patient presents with  . Gynecologic Exam    HPI:      [name] is a 57 y.o. [phone] who LMP was No LMP recorded. Patient has had a hysterectomy., presents today for her annual examination.  Her menses are absent due to hyst for CPP yrs ago. She does not have intermenstrual bleeding.  She does have vasomotor sx. She uses activella with sx improvement. No side effects. Wants to cont meds.   Sex activity: single partner, contraception - status post hysterectomy. She does have vaginal dryness, uses lubricants with relief.  Last Pap: 06/17/17  Results were: no abnormalities /neg HPV DNA.  Hx of STDs: none  Last mammogram: July 13, 2015  Results were: normal--routine follow-up in 12 months There is a FH of breast cancer in her mother and mat aunt, genetic testing not done. NCCN guidelines changed this yr regarding triple neg breast cancer and pt qualification. There is no FH of ovarian cancer. The patient does do self-breast exams.  Colonoscopy: colonoscopy 6 years ago without abnormalities. Pt followed by PCP for colonoscopy sched.   Tobacco use: The patient denies current or previous tobacco use. Alcohol use: social drinker Exercise: moderately active  She does get adequate calcium but not Vitamin D in her diet.  Labs with PCP  Past Medical History:  Diagnosis Date  . Arthritis   . Lung blebs (HCC)   . Pneumothorax     Past Surgical History:  Procedure Laterality Date  . ABDOMINAL HYSTERECTOMY    . c sections    . CESAREAN SECTION    . LUNG SURGERY      Family History  Problem Relation Age of Onset  . Diabetes Mother        DM Type 2  . Stroke Mother   . Breast cancer Mother 58       triple neg  . Heart disease Mother   . Hypertension Mother   . Heart disease Father   . Hypertension Father   . Diabetes Father        DM Type 2  . Transient ischemic attack Father   . Breast cancer Maternal Aunt 64  . Lung  cancer Cousin     Social History   Socioeconomic History  . Marital status: Married    Spouse name: Not on file  . Number of children: Not on file  . Years of education: Not on file  . Highest education level: Not on file  Occupational History  . Not on file  Social Needs  . Financial resource strain: Not on file  . Food insecurity:    Worry: Not on file    Inability: Not on file  . Transportation needs:    Medical: Not on file    Non-medical: Not on file  Tobacco Use  . Smoking status: Never Smoker  . Smokeless tobacco: Never Used  Substance and Sexual Activity  . Alcohol use: Yes  . Drug use: No  . Sexual activity: Yes    Birth control/protection: Surgical    Comment: Hysterectomy  Lifestyle  . Physical activity:    Days per week: Not on file    Minutes per session: Not on file  . Stress: Not on file  Relationships  . Social connections:    Talks on phone: Not on file    Gets together: Not on file    Attends religious service: Not on file  Active member of club or organization: Not on file    Attends meetings of clubs or organizations: Not on file    Relationship status: Not on file  . Intimate partner violence:    Fear of current or ex partner: Not on file    Emotionally abused: Not on file    Physically abused: Not on file    Forced sexual activity: Not on file  Other Topics Concern  . Not on file  Social History Narrative  . Not on file    Current Meds  Medication Sig  . albuterol (VENTOLIN HFA) 108 (90 Base) MCG/ACT inhaler Ventolin HFA 90 mcg/actuation aerosol inhaler  . ALPRAZolam (XANAX) 0.25 MG tablet Take by mouth.  . butalbital-acetaminophen-caffeine (FIORICET, ESGIC) 50-325-40 MG tablet   . cyclobenzaprine (FLEXERIL) 5 MG tablet   . estradiol-norethindrone (ACTIVELLA) 1-0.5 MG tablet Take 1 tablet by mouth daily.  Marland Kitchen. etodolac (LODINE) 200 MG capsule Take 200 mg by mouth every 8 (eight) hours.  . [DISCONTINUED] estradiol-norethindrone  (ACTIVELLA) 1-0.5 MG tablet Take 1 tablet by mouth daily.      ROS:  Review of Systems  Constitutional: Negative for fatigue, fever and unexpected weight change.  Respiratory: Negative for cough, shortness of breath and wheezing.   Cardiovascular: Negative for chest pain, palpitations and leg swelling.  Gastrointestinal: Negative for blood in stool, constipation, diarrhea, nausea and vomiting.  Endocrine: Negative for cold intolerance, heat intolerance and polyuria.  Genitourinary: Negative for dyspareunia, dysuria, flank pain, frequency, genital sores, hematuria, menstrual problem, pelvic pain, urgency, vaginal bleeding, vaginal discharge and vaginal pain.  Musculoskeletal: Negative for back pain, joint swelling and myalgias.  Skin: Negative for color change and rash.  Neurological: Negative for dizziness, syncope, light-headedness, numbness and headaches.  Hematological: Negative for adenopathy.  Psychiatric/Behavioral: Negative for agitation, confusion, sleep disturbance and suicidal ideas. The patient is not nervous/anxious.      Objective: BP 104/70   Pulse 75   Ht 5\' 7"  (1.702 m)   Wt 148 lb (67.1 kg)   BMI 23.18 kg/m    Physical Exam Constitutional:      Appearance: She is well-developed.  Genitourinary:     Vagina normal.     No vaginal discharge, erythema or tenderness.     No right or left adnexal mass present.     Right adnexa not tender.     Left adnexa not tender.     Genitourinary Comments: UTERUS/CX SURG REM  Neck:     Musculoskeletal: Normal range of motion.     Thyroid: No thyromegaly.  Cardiovascular:     Rate and Rhythm: Normal rate and regular rhythm.     Heart sounds: Normal heart sounds. No murmur.  Pulmonary:     Effort: Pulmonary effort is normal.     Breath sounds: Normal breath sounds.  Chest:     Breasts:        Right: No mass, nipple discharge, skin change or tenderness.        Left: No mass, nipple discharge, skin change or tenderness.   Abdominal:     Palpations: Abdomen is soft.     Tenderness: There is no abdominal tenderness. There is no guarding.  Musculoskeletal: Normal range of motion.  Neurological:     Mental Status: She is alert and oriented to person, place, and time.     Cranial Nerves: No cranial nerve deficit.  Psychiatric:        Behavior: Behavior normal.  Vitals signs reviewed.  Assessment/Plan:  Encounter for annual routine gynecological examination  Screening for breast cancer - Pt to sched mammo - Plan: MM 3D SCREEN BREAST BILATERAL  Family history of breast cancer - MyRisk testing discussed and pt declines.  - Plan: MM 3D SCREEN BREAST BILATERAL  Hormone replacement therapy (HRT) - Rx RF activella. F/u prn. - Plan: estradiol-norethindrone (ACTIVELLA) 1-0.5 MG tablet   Meds ordered this encounter  Medications  . estradiol-norethindrone (ACTIVELLA) 1-0.5 MG tablet    Sig: Take 1 tablet by mouth daily.    Dispense:  90 tablet    Refill:  3    Order Specific Question:   Supervising Provider    Answer:   Nadara MustardHARRIS, ROBERT P [[id number]]            GYN counsel breast self exam, mammography screening, use and side effects of HRT, menopause, adequate intake of calcium and vitamin D, diet and exercise    F/U  Return in about 1 year (around 09/08/2019).  Alicia B. Copland, PA-C 09/07/2018 4:25 PM

## 2018-09-07 NOTE — Patient Instructions (Addendum)
I value your feedback and entrusting us with your care. If you get a [facility] patient survey, I would appreciate you taking the time to let us know about your experience today. Thank you!  [facility]: [phone]    

## 2019-05-01 DIAGNOSIS — Z87828 Personal history of other (healed) physical injury and trauma: Secondary | ICD-10-CM

## 2019-05-01 HISTORY — DX: Personal history of other (healed) physical injury and trauma: Z87.828

## 2019-05-04 ENCOUNTER — Encounter: Payer: Self-pay | Admitting: Neurology

## 2019-05-04 ENCOUNTER — Telehealth: Payer: Self-pay | Admitting: Neurology

## 2019-05-04 ENCOUNTER — Ambulatory Visit (INDEPENDENT_AMBULATORY_CARE_PROVIDER_SITE_OTHER): Payer: Managed Care, Other (non HMO) | Admitting: Neurology

## 2019-05-04 ENCOUNTER — Other Ambulatory Visit: Payer: Self-pay

## 2019-05-04 VITALS — BP 130/75 | HR 62 | Temp 98.2°F | Ht 67.0 in | Wt 145.2 lb

## 2019-05-04 DIAGNOSIS — H5462 Unqualified visual loss, left eye, normal vision right eye: Secondary | ICD-10-CM | POA: Diagnosis not present

## 2019-05-04 DIAGNOSIS — S0990XS Unspecified injury of head, sequela: Secondary | ICD-10-CM

## 2019-05-04 DIAGNOSIS — S0990XA Unspecified injury of head, initial encounter: Secondary | ICD-10-CM

## 2019-05-04 HISTORY — DX: Unspecified injury of head, initial encounter: S09.90XA

## 2019-05-04 NOTE — Progress Notes (Signed)
PATIENT: Cassidy Suarez Daniel DOB: [birth year]  Chief Complaint  Patient presents with  . Urgent Referral    Left visual field disturbance post head injury (accident while ziplining on 05/01/2019).  Marland Kitchen. Optometry    Myrene GalasWoodard, Robert, [place] - referring provider  . PCP    Jaclyn Shaggyate, Denny C, MD     HISTORICAL  Cassidy Suarez [place] is a 58 years old female, seen in request by her primary care physician Dr. Arlana Pouchate, Katherina Rightenny for evaluation of left visual field disturbance post head injury.  Initial evaluation was on May 04, 2019.  I have reviewed and summarized the referring note from the referring physician.  She did have a history of migraine headaches, her typical migraine holoacranial retro-orbital area pounding headache with associated light noise sensitivity, nauseous, lasting for few hours, she only has it occasionally  She went zip lining May 01, 2019 wearing helmet, with a sudden stop, she bumped her left helmet to to stopping block, she had a hyperextension of her neck, really jolted, next day on May 02, 2019, she felt mild left side headaches, then on May 03, 2019, she noticed visual changes, there was a V-shaped colorful prisma in her left eye field, when she covered her right eye she noticed distortion of left vision, but if she cover her left eye, she can see well with right or right only  Over the past couple days, she continued to have persistent left side blurry vision, distortion, also color with washout  I reviewed referring physician Dr. Clydene PughWoodard eye care evaluation of May 04, 2019, 20/50, OS 20/50-1, double 20/40-1 to involve the scope examination of the retina showed normal vessel, normal peripheral appearance visual field testing showed significant visual field deficit  She also noticed mild left arm clumsiness  REVIEW OF SYSTEMS: Full 14 system review of systems performed and notable only for as above All other review of systems were negative.  ALLERGIES: No Known Allergies   HOME MEDICATIONS: Current Outpatient Medications  Medication Sig Dispense Refill  . albuterol (VENTOLIN HFA) 108 (90 Base) MCG/ACT inhaler Inhale 1 puff into the lungs as needed.     . ALPRAZolam (XANAX) 0.25 MG tablet Take 0.25 mg by mouth as needed.     Marland Kitchen. estradiol-norethindrone (ACTIVELLA) 1-0.5 MG tablet Take 1 tablet by mouth daily. 90 tablet 3   No current facility-administered medications for this visit.     PAST MEDICAL HISTORY: Past Medical History:  Diagnosis Date  . Arthritis   . Asthma   . Lung blebs (HCC)   . Pneumothorax   . Visual disturbance     PAST SURGICAL HISTORY: Past Surgical History:  Procedure Laterality Date  . ABDOMINAL HYSTERECTOMY    . c sections    . CESAREAN SECTION    . LUNG SURGERY      FAMILY HISTORY: Family History  Problem Relation Age of Onset  . Diabetes Mother        DM Type 2  . Stroke Mother   . Breast cancer Mother [id number]       triple neg  . Heart disease Mother   . Hypertension Mother   . Heart disease Father   . Hypertension Father   . Diabetes Father        DM Type 2  . Transient ischemic attack Father   . Breast cancer Maternal Aunt 64  . Lung cancer Cousin     SOCIAL HISTORY: Social History   Socioeconomic History  . Marital status:  Married    Spouse name: Not on file  . Number of children: 2  . Years of education: college  . Highest education level: Not on file  Occupational History  . Occupation: Visual merchandisercommunity navigator  Social Needs  . Financial resource strain: Not on file  . Food insecurity    Worry: Not on file    Inability: Not on file  . Transportation needs    Medical: Not on file    Non-medical: Not on file  Tobacco Use  . Smoking status: Former Games developermoker  . Smokeless tobacco: Never Used  Substance and Sexual Activity  . Alcohol use: Yes    Comment: social  . Drug use: Never  . Sexual activity: Yes    Birth control/protection: Surgical    Comment: Hysterectomy  Lifestyle  . Physical activity     Days per week: Not on file    Minutes per session: Not on file  . Stress: Not on file  Relationships  . Social Musicianconnections    Talks on phone: Not on file    Gets together: Not on file    Attends religious service: Not on file    Active member of club or organization: Not on file    Attends meetings of clubs or organizations: Not on file    Relationship status: Not on file  . Intimate partner violence    Fear of current or ex partner: Not on file    Emotionally abused: Not on file    Physically abused: Not on file    Forced sexual activity: Not on file  Other Topics Concern  . Not on file  Social History Narrative   Lives at home with her husband.  Her parents live with her right now.  She is their caregiver.   Two cups coffee, one soda per day.   Right-handed.     PHYSICAL EXAM   Vitals:   05/04/19 1442  BP: 130/75  Pulse: 62  Temp: 98.2 F (36.8 C)  Weight: 145 lb 4 oz (65.9 kg)  Height: 5\' 7"  (1.702 m)    Not recorded      Body mass index is 22.75 kg/m.  PHYSICAL EXAMNIATION:  Gen: NAD, conversant, well nourised, obese, well groomed                     Cardiovascular: Regular rate rhythm, no peripheral edema, warm, nontender. Eyes: Conjunctivae clear without exudates or hemorrhage Neck: Supple, no carotid bruits. Pulmonary: Clear to auscultation bilaterally   NEUROLOGICAL EXAM:  MENTAL STATUS: Speech:    Speech is normal; fluent and spontaneous with normal comprehension.  Cognition:     Orientation to time, place and person     Normal recent and remote memory     Normal Attention span and concentration     Normal Language, naming, repeating,spontaneous speech     Fund of knowledge   CRANIAL NERVES: CN II: Decreased left visual field on confrontational test, right visual field was normal. Pupils are round equal and briskly reactive to light. CN III, IV, VI: extraocular movement are normal. No ptosis. CN V: Facial sensation is intact to pinprick in  all 3 divisions bilaterally. Corneal responses are intact.  CN VII: Face is symmetric with normal eye closure and smile. CN VIII: Hearing is normal to rubbing fingers CN IX, X: Palate elevates symmetrically. Phonation is normal. CN XI: Head turning and shoulder shrug are intact CN XII: Tongue is midline with normal movements and no  atrophy.  MOTOR: Left arm fixation on rapid rotating movement, pronation drift  REFLEXES: Reflexes are 2+ and symmetric at the biceps, triceps, knees, and ankles. Plantar responses are flexor.  SENSORY: Intact to light touch, pinprick, positional sensation and vibratory sensation are intact in fingers and toes.  COORDINATION: Rapid alternating movements and fine finger movements are intact. There is no dysmetria on finger-to-nose and heel-knee-shin.    GAIT/STANCE: Posture is normal. Gait is steady with normal steps, base, arm swing, and turning. Heel and toe walking are normal. Tandem gait is normal.  Romberg is absent.   DIAGNOSTIC DATA (LABS, IMAGING, TESTING) - I reviewed patient records, labs, notes, testing and imaging myself where available.   ASSESSMENT AND PLAN  IDALY VERRET is a 58 y.o. female   Acute onset left visual change following head trauma to left supraorbital region  There was evidence of left visual field change, left upper extremity weakness  Need to rule out right occipital/parietal structural lesion, and intrinsic left eye disease  Proceed with stat MRI of the brain with without contrast, albeit with without contrast   Marcial Pacas, M.Suarez. Ph.Suarez.  [facility] [address] [place], [place] [address] Ph: [phone] Fax: [phone]  CC: Albina Billet, MD

## 2019-05-04 NOTE — Addendum Note (Signed)
Addended by: Marcial Pacas on: 05/04/2019 03:33 PM   Modules accepted: Orders

## 2019-05-04 NOTE — Telephone Encounter (Signed)
Just an [employer] pending I gave all the information to the [employer] Nurse she stated she was going to send all the information to their physician I stated that it was urgent she stated they have up to 4 hours to review and if it still doesn't get approved then Dr. Krista Blue can call and speak with their physician.

## 2019-05-05 ENCOUNTER — Telehealth: Payer: Self-pay | Admitting: Neurology

## 2019-05-05 LAB — CBC WITH DIFFERENTIAL/PLATELET
Basophils Absolute: 0 10*3/uL (ref 0.0–0.2)
Basos: 0 %
EOS (ABSOLUTE): 0.2 10*3/uL (ref 0.0–0.4)
Eos: 1 %
Hematocrit: 40.4 % (ref 34.0–46.6)
Hemoglobin: 13.9 g/dL (ref 11.1–15.9)
Immature Grans (Abs): 0 10*3/uL (ref 0.0–0.1)
Immature Granulocytes: 0 %
Lymphocytes Absolute: 1.8 10*3/uL (ref 0.7–3.1)
Lymphs: 15 %
MCH: 30.3 pg (ref 26.6–33.0)
MCHC: 34.4 g/dL (ref 31.5–35.7)
MCV: 88 fL (ref 79–97)
Monocytes Absolute: 0.7 10*3/uL (ref 0.1–0.9)
Monocytes: 6 %
Neutrophils Absolute: 9.4 10*3/uL — ABNORMAL HIGH (ref 1.4–7.0)
Neutrophils: 78 %
Platelets: 280 10*3/uL (ref 150–450)
RBC: 4.59 x10E6/uL (ref 3.77–5.28)
RDW: 12.6 % (ref 11.7–15.4)
WBC: 12.2 10*3/uL — ABNORMAL HIGH (ref 3.4–10.8)

## 2019-05-05 LAB — COMPREHENSIVE METABOLIC PANEL
ALT: 10 IU/L (ref 0–32)
AST: 12 IU/L (ref 0–40)
Albumin/Globulin Ratio: 1.9 (ref 1.2–2.2)
Albumin: 4.5 g/dL (ref 3.8–4.9)
Alkaline Phosphatase: 41 IU/L (ref 39–117)
BUN/Creatinine Ratio: 13 (ref 9–23)
BUN: 12 mg/dL (ref 6–24)
Bilirubin Total: 0.5 mg/dL (ref 0.0–1.2)
CO2: 25 mmol/L (ref 20–29)
Calcium: 9.7 mg/dL (ref 8.7–10.2)
Chloride: 100 mmol/L (ref 96–106)
Creatinine, Ser: 0.9 mg/dL (ref 0.57–1.00)
GFR calc Af Amer: 83 mL/min/{1.73_m2} (ref >59)
GFR calc non Af Amer: 72 mL/min/{1.73_m2} (ref >59)
Globulin, Total: 2.4 g/dL (ref 1.5–4.5)
Glucose: 109 mg/dL — ABNORMAL HIGH (ref 65–99)
Potassium: 3.9 mmol/L (ref 3.5–5.2)
Sodium: 139 mmol/L (ref 134–144)
Total Protein: 6.9 g/dL (ref 6.0–8.5)

## 2019-05-05 LAB — VITAMIN B12: Vitamin B-12: 268 pg/mL (ref 232–1245)

## 2019-05-05 LAB — TSH: TSH: 1.96 u[IU]/mL (ref 0.450–4.500)

## 2019-05-05 NOTE — Telephone Encounter (Signed)
Cassidy Suarez: [id number] (exp. 05/04/19 to 10/31/19)  Patient is scheduled at [facility] for tomorrow 05/06/19 at 1:15 pm because she wanted it at 3 pm at [facility] but I had nothing available after 3 pm.

## 2019-05-05 NOTE — Telephone Encounter (Signed)
Please call patient, laboratory evaluation showed mild elevated WBC 12.2, and absolute neutrophil 9.4, with normal B12, TSH,  Mild elevated glucose on CMP 109, this can be related to the timing of the blood sample and the last meal,  Please check to see if she has any signs of infection, fever, UTI,URI?

## 2019-05-05 NOTE — Telephone Encounter (Signed)
I have spoken to the patient and she is aware of lab results.  Denies any signs or symptoms of any type of infection (no fever, painful or frequent urination, no cough or nasal drainage, etc).

## 2019-05-06 NOTE — Addendum Note (Signed)
Addended by: Marcial Pacas on: 05/06/2019 12:31 PM   Modules accepted: Orders

## 2019-05-06 NOTE — Telephone Encounter (Signed)
Just an FYI  I got a call stating that the patient appointment with [facility] had to be cancel due to the machine being down. The patient wanted her MRI to be somewhere closer to home and that was [place]. The first available that [facility] had that they can squeeze an urgent MRI in for is for tomorrow 05/07/19 at 1:30 pm. I spoke to the patient and informed her of this and she is aware to be at [facility] tomorrow 05/07/19 at 1:30 pm.    Dr. Krista Blue they will give you a call on your cell phone with the report.

## 2019-05-07 ENCOUNTER — Ambulatory Visit
Admission: RE | Admit: 2019-05-07 | Discharge: 2019-05-07 | Disposition: A | Discharge status: Home / Self Care | Payer: 59 | Source: Ambulatory Visit | Attending: Neurology | Admitting: Neurology

## 2019-05-07 ENCOUNTER — Other Ambulatory Visit: Payer: Self-pay

## 2019-05-07 DIAGNOSIS — S0990XS Unspecified injury of head, sequela: Secondary | ICD-10-CM

## 2019-05-07 DIAGNOSIS — H5462 Unqualified visual loss, left eye, normal vision right eye: Secondary | ICD-10-CM | POA: Insufficient documentation

## 2019-05-07 MED ORDER — GADOBUTROL 1 MMOL/ML IV SOLN
6.0000 mL | Freq: Once | INTRAVENOUS | Status: AC | PRN
  Administered 2019-05-07: 6 mL via INTRAVENOUS

## 2019-05-10 ENCOUNTER — Telehealth: Payer: Self-pay | Admitting: Neurology

## 2019-05-10 DIAGNOSIS — H5462 Unqualified visual loss, left eye, normal vision right eye: Secondary | ICD-10-CM

## 2019-05-10 NOTE — Telephone Encounter (Signed)
Please call patient, MRI of brain w/o and MRI of orbits w/wo were normal.

## 2019-05-10 NOTE — Telephone Encounter (Signed)
The patient has been informed of her MRI results and verbalized understanding.  Per vo by Dr. Krista Blue, offer ophthalmology referral.  The patient is agreeable and it has been placed in [place].  [facility] is also aware of this plan.

## 2019-05-10 NOTE — Telephone Encounter (Signed)
Pt called wanting to know if her MRI results have come in. Please advise. 

## 2019-05-10 NOTE — Telephone Encounter (Signed)
Jamie @ [facility] is asking for a call from [employer]

## 2019-05-10 NOTE — Addendum Note (Signed)
Addended by: Desmond Lope on: 05/10/2019 04:25 PM   Modules accepted: Orders

## 2019-05-17 ENCOUNTER — Other Ambulatory Visit: Payer: Self-pay

## 2019-05-17 DIAGNOSIS — Z20822 Contact with and (suspected) exposure to covid-19: Secondary | ICD-10-CM

## 2019-05-19 LAB — NOVEL CORONAVIRUS, NAA: SARS-CoV-2, NAA: NOT DETECTED

## 2019-05-26 ENCOUNTER — Other Ambulatory Visit: Payer: Self-pay | Admitting: Obstetrics and Gynecology

## 2019-05-26 DIAGNOSIS — Z79890 Hormone replacement therapy (postmenopausal): Secondary | ICD-10-CM

## 2019-12-08 ENCOUNTER — Other Ambulatory Visit: Payer: Self-pay | Admitting: Obstetrics and Gynecology

## 2019-12-08 DIAGNOSIS — Z79890 Hormone replacement therapy (postmenopausal): Secondary | ICD-10-CM

## 2019-12-08 MED ORDER — ESTRADIOL-NORETHINDRONE ACET 1-0.5 MG PO TABS: 1.0000 | ORAL_TABLET | Freq: Every day | ORAL | 0 refills | Status: DC

## 2019-12-08 NOTE — Telephone Encounter (Signed)
Patient has scheduled apt for 01/03/2020. Requesting refill to get to apt. LP#[phone]

## 2019-12-08 NOTE — Telephone Encounter (Signed)
Pls notify pt Rx RF eRxd.

## 2019-12-08 NOTE — Progress Notes (Signed)
Rx RF HRT till 4/21 annual

## 2019-12-08 NOTE — Telephone Encounter (Signed)
Rx RF eRxd.  

## 2019-12-08 NOTE — Telephone Encounter (Signed)
Pt aware.

## 2020-01-03 ENCOUNTER — Ambulatory Visit (INDEPENDENT_AMBULATORY_CARE_PROVIDER_SITE_OTHER): Payer: 59 | Admitting: Obstetrics and Gynecology

## 2020-01-03 ENCOUNTER — Other Ambulatory Visit: Payer: Self-pay

## 2020-01-03 ENCOUNTER — Encounter: Payer: Self-pay | Admitting: Obstetrics and Gynecology

## 2020-01-03 VITALS — BP 100/70 | Ht 67.0 in | Wt 151.0 lb

## 2020-01-03 DIAGNOSIS — Z79890 Hormone replacement therapy (postmenopausal): Secondary | ICD-10-CM

## 2020-01-03 DIAGNOSIS — Z1231 Encounter for screening mammogram for malignant neoplasm of breast: Secondary | ICD-10-CM

## 2020-01-03 DIAGNOSIS — Z01419 Encounter for gynecological examination (general) (routine) without abnormal findings: Secondary | ICD-10-CM | POA: Diagnosis not present

## 2020-01-03 DIAGNOSIS — Z803 Family history of malignant neoplasm of breast: Secondary | ICD-10-CM

## 2020-01-03 MED ORDER — ESTRADIOL-NORETHINDRONE ACET 1-0.5 MG PO TABS: 1.0000 | ORAL_TABLET | Freq: Every day | ORAL | 3 refills | Status: DC

## 2020-01-03 NOTE — Patient Instructions (Signed)
I value your feedback and entrusting us with your care. If you get a [facility] patient survey, I would appreciate you taking the time to let us know about your experience today. Thank you!  As of August 26, 2019, your lab results will be released to your MyChart immediately, before I even have a chance to see them. Please give me time to review them and contact you if there are any abnormalities. Thank you for your patience.   [facility]: [phone]  [facility]: [phone]  

## 2020-01-03 NOTE — Progress Notes (Signed)
PCP: Jaclyn Shaggy, MD   Chief Complaint  Patient presents with  . Gynecologic Exam    HPI:      [name] is a 59 y.o. [phone] who LMP was No LMP recorded. Patient has had a hysterectomy., presents today for her annual examination.  Her menses are absent due to TAH for CPP yrs ago. She does not have postmenopausal bleeding.  She does have vasomotor sx. She uses activella with sx improvement. No side effects. Wants to cont meds.   Sex activity: single partner, contraception - status post hysterectomy. She does have vaginal dryness, uses lubricants with relief.  Last Pap: 06/17/17  Results were: no abnormalities /neg HPV DNA.  Hx of STDs: none  Last mammogram: July 13, 2015  Results were: normal--routine follow-up in 12 months. 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 Understands importance of mammos with FH and HRT.  There is a FH of breast cancer in her mother (triple neg), mat aunt, and mat grt aunts. Genetic testing not done, declined by pt last appt. There is no FH of ovarian cancer. The patient does do self-breast exams.  Colonoscopy: colonoscopy 7 years ago without abnormalities. Pt followed by PCP for colonoscopy sched.   Tobacco use: The patient denies current or previous tobacco use. Alcohol use: social drinker  No drug use. Exercise: moderately active  She does get adequate calcium and Vitamin D in her diet.  Still having issues with SUI, incontinence with walking and during sex. Saw Dr. 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   Labs with PCP but he no longer takes her insurance, so needs to find new one.  Past Medical History:  Diagnosis Date  . Arthritis   . Asthma   . Lung blebs (HCC)   . Mixed incontinence   . Pneumothorax   . Visual disturbance     Past Surgical History:  Procedure Laterality Date  . ABDOMINAL HYSTERECTOMY    . c sections    . CESAREAN  SECTION    . LUNG SURGERY      Family History  Problem Relation Age of Onset  . Diabetes Mother        DM Type 2  . Stroke Mother   . Breast cancer Mother 14       triple neg  . Heart disease Mother   . Hypertension Mother   . Alzheimer's disease Mother   . Heart disease Father   . Hypertension Father   . Diabetes Father        DM Type 2  . Transient ischemic attack Father   . Breast cancer Maternal Aunt 64  . Lung cancer Cousin   . Breast cancer Other     Social History   Socioeconomic History  . Marital status: Married    Spouse name: Not on file  . Number of children: 2  . Years of education: college  . Highest education level: Not on file  Occupational History  . Occupation: Visual merchandiser  Tobacco Use  . Smoking status: Former Games developer  . Smokeless tobacco: Never Used  Substance and Sexual Activity  . Alcohol use: Yes    Comment: social  . Drug use: Never  . Sexual activity: Yes    Birth control/protection: Surgical    Comment: Hysterectomy  Other Topics Concern  . Not on file  Social History Narrative  Lives at home with her husband.  Her parents live with her right now.  She is their caregiver.   Two cups coffee, one soda per day.   Right-handed.   Social Determinants of Health   Financial Resource Strain:   . Difficulty of Paying Living Expenses:   Food Insecurity:   . Worried About Charity fundraiser in the Last Year:   . Arboriculturist in the Last Year:   Transportation Needs:   . Film/video editor (Medical):   Marland Kitchen Lack of Transportation (Non-Medical):   Physical Activity:   . Days of Exercise per Week:   . Minutes of Exercise per Session:   Stress:   . Feeling of Stress :   Social Connections:   . Frequency of Communication with Friends and Family:   . Frequency of Social Gatherings with Friends and Family:   . Attends Religious Services:   . Active Member of Clubs or Organizations:   . Attends Archivist Meetings:    Marland Kitchen Marital Status:   Intimate Partner Violence:   . Fear of Current or Ex-Partner:   . Emotionally Abused:   Marland Kitchen Physically Abused:   . Sexually Abused:     Current Meds  Medication Sig  . albuterol (VENTOLIN HFA) 108 (90 Base) MCG/ACT inhaler Inhale 1 puff into the lungs as needed.   . ALPRAZolam (XANAX) 0.25 MG tablet Take 0.25 mg by mouth as needed.   Marland Kitchen estradiol-norethindrone (ACTIVELLA) 1-0.5 MG tablet Take 1 tablet by mouth daily.  . [DISCONTINUED] estradiol-norethindrone (ACTIVELLA) 1-0.5 MG tablet Take 1 tablet by mouth daily.     ROS:  Review of Systems  Constitutional: Negative for fatigue, fever and unexpected weight change.  Respiratory: Negative for cough, shortness of breath and wheezing.   Cardiovascular: Negative for chest pain, palpitations and leg swelling.  Gastrointestinal: Negative for blood in stool, constipation, diarrhea, nausea and vomiting.  Endocrine: Negative for cold intolerance, heat intolerance and polyuria.  Genitourinary: Negative for dyspareunia, dysuria, flank pain, frequency, genital sores, hematuria, menstrual problem, pelvic pain, urgency, vaginal bleeding, vaginal discharge and vaginal pain.  Musculoskeletal: Negative for back pain, joint swelling and myalgias.  Skin: Negative for color change and rash.  Neurological: Negative for dizziness, syncope, light-headedness, numbness and headaches.  Hematological: Negative for adenopathy.  Psychiatric/Behavioral: Negative for agitation, confusion, sleep disturbance and suicidal ideas. The patient is not nervous/anxious.      Objective: BP 100/70   Ht 5\' 7"  (1.702 m)   Wt 151 lb (68.5 kg)   BMI 23.65 kg/m    Physical Exam Constitutional:      Appearance: She is well-developed.  Genitourinary:     Vulva and vagina normal.     No vaginal discharge, erythema or tenderness.     No right or left adnexal mass present.     Right adnexa not tender.     Left adnexa not tender.     Genitourinary  Comments: UTERUS/CX SURG REM  Neck:     Thyroid: No thyromegaly.  Cardiovascular:     Rate and Rhythm: Normal rate and regular rhythm.     Heart sounds: Normal heart sounds. No murmur.  Pulmonary:     Effort: Pulmonary effort is normal.     Breath sounds: Normal breath sounds.  Chest:     Breasts:        Right: No mass, nipple discharge, skin change or tenderness.        Left: No mass, nipple  discharge, skin change or tenderness.  Abdominal:     Palpations: Abdomen is soft.     Tenderness: There is no abdominal tenderness. There is no guarding.  Musculoskeletal:        General: Normal range of motion.     Cervical back: Normal range of motion.  Neurological:     General: No focal deficit present.     Mental Status: She is alert and oriented to person, place, and time.     Cranial Nerves: No cranial nerve deficit.  Skin:    General: Skin is warm and dry.  Psychiatric:        Mood and Affect: Mood normal.        Behavior: Behavior normal.        Thought Content: Thought content normal.        Judgment: Judgment normal.  Vitals reviewed.     Assessment/Plan:  Encounter for annual routine gynecological examination  Encounter for screening mammogram for malignant neoplasm of breast - Plan: MM 3D SCREEN BREAST BILATERAL; pt to sched mammo. Aware of need to have it done.  Family history of breast cancer - Plan: MM 3D SCREEN BREAST BILATERAL; MyRisk testing discussed. Pt to f/u if desires.   Hormone replacement therapy (HRT) - Rx RF activella. F/u prn. - Plan: estradiol-norethindrone (ACTIVELLA) 1-0.5 MG tablet   Meds ordered this encounter  Medications  . estradiol-norethindrone (ACTIVELLA) 1-0.5 MG tablet    Sig: Take 1 tablet by mouth daily.    Dispense:  90 tablet    Refill:  3    Order Specific Question:   Supervising Provider    Answer:   Nadara Mustard [[id number]]            GYN counsel breast self exam, mammography screening, adequate intake of calcium and  vitamin D, diet and exercise    F/U  Return in about 1 year (around 01/02/2021).  Jaely Silman B. Maryln Eastham, PA-C 01/03/2020 2:41 PM

## 2020-07-31 ENCOUNTER — Ambulatory Visit: Payer: 59 | Admitting: Internal Medicine

## 2020-07-31 ENCOUNTER — Other Ambulatory Visit: Payer: Self-pay

## 2020-07-31 ENCOUNTER — Ambulatory Visit
Admission: RE | Admit: 2020-07-31 | Discharge: 2020-07-31 | Disposition: A | Discharge status: Home / Self Care | Payer: 59 | Attending: Internal Medicine | Admitting: Internal Medicine

## 2020-07-31 ENCOUNTER — Encounter: Payer: Self-pay | Admitting: Internal Medicine

## 2020-07-31 ENCOUNTER — Ambulatory Visit
Admission: RE | Admit: 2020-07-31 | Discharge: 2020-07-31 | Disposition: A | Discharge status: Home / Self Care | Payer: 59 | Source: Ambulatory Visit | Attending: Internal Medicine | Admitting: Internal Medicine

## 2020-07-31 VITALS — BP 118/62 | HR 80 | Ht 67.0 in | Wt 147.0 lb

## 2020-07-31 DIAGNOSIS — G8929 Other chronic pain: Secondary | ICD-10-CM | POA: Insufficient documentation

## 2020-07-31 DIAGNOSIS — F411 Generalized anxiety disorder: Secondary | ICD-10-CM | POA: Diagnosis not present

## 2020-07-31 DIAGNOSIS — M545 Low back pain, unspecified: Secondary | ICD-10-CM | POA: Insufficient documentation

## 2020-07-31 DIAGNOSIS — M542 Cervicalgia: Secondary | ICD-10-CM | POA: Diagnosis not present

## 2020-07-31 DIAGNOSIS — R109 Unspecified abdominal pain: Secondary | ICD-10-CM | POA: Diagnosis not present

## 2020-07-31 DIAGNOSIS — Z90710 Acquired absence of both cervix and uterus: Secondary | ICD-10-CM

## 2020-07-31 LAB — POCT URINALYSIS DIPSTICK
Bilirubin, UA: NEGATIVE
Glucose, UA: NEGATIVE
Ketones, UA: NEGATIVE
Leukocytes, UA: NEGATIVE
Nitrite, UA: NEGATIVE
Protein, UA: NEGATIVE
Spec Grav, UA: 1.02 (ref 1.010–1.025)
Urobilinogen, UA: 0.2 E.U./dL
pH, UA: 5 (ref 5.0–8.0)

## 2020-07-31 MED ORDER — MELOXICAM 15 MG PO TABS: 15.0000 mg | ORAL_TABLET | Freq: Every day | ORAL | 0 refills | Status: DC

## 2020-07-31 NOTE — Progress Notes (Signed)
Date:  07/31/2020   Name:  Cassidy Suarez   DOB:  02/22/1961   MRN:  [id number]   Chief Complaint: Establish Care (New Patient.) and Flank Pain (Left flank pain X 2 weeks. Constant pain- hurts worse to touch. )  Back Pain This is a chronic problem. The current episode started more than 1 year ago. The problem is unchanged. The pain is present in the lumbar spine. The quality of the pain is described as aching. The pain does not radiate. The pain is moderate. Stiffness is present all day. Pertinent negatives include no abdominal pain, bladder incontinence, bowel incontinence, chest pain, dysuria, fever, headaches, weakness or weight loss. 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 She has tried NSAIDs and muscle relaxant for the symptoms. The treatment provided mild relief.  Neck Pain  This is a chronic problem. The problem occurs daily. The problem has been waxing and waning. The pain is present in the midline. The quality of the pain is described as cramping. The pain is mild. Pertinent negatives include no chest pain, fever, headaches, weakness or weight loss. She has tried muscle relaxants (takes Flexeril several times per week for years) for the symptoms.  Anxiety Presents for follow-up visit. Symptoms include irritability and nervous/anxious behavior. Patient reports no chest pain, depressed mood, dizziness, insomnia, palpitations, restlessness or shortness of breath. Symptoms occur occasionally (takes Xanax about once per week).      Lab Results  Component Value Date   CREATININE 0.90 05/04/2019   BUN 12 05/04/2019   NA 139 05/04/2019   K 3.9 05/04/2019   CL 100 05/04/2019   CO2 25 05/04/2019   No results found for: CHOL, HDL, LDLCALC, LDLDIRECT, TRIG, CHOLHDL Lab Results  Component Value Date   TSH 1.960 05/04/2019   No results found for: HGBA1C Lab Results  Component Value Date   WBC 12.2 (H) 05/04/2019   HGB 13.9  05/04/2019   HCT 40.4 05/04/2019   MCV 88 05/04/2019   PLT 280 05/04/2019   Lab Results  Component Value Date   ALT 10 05/04/2019   AST 12 05/04/2019   ALKPHOS 41 05/04/2019   BILITOT 0.5 05/04/2019     Review of Systems  Constitutional: Positive for irritability. Negative for chills, fatigue, fever, unexpected weight change and weight loss.  Eyes: Positive for visual disturbance (since head injury last year).  Respiratory: Negative for chest tightness and shortness of breath.   Cardiovascular: Negative for chest pain, palpitations and leg swelling.  Gastrointestinal: Negative for abdominal pain, blood in stool, bowel incontinence and constipation.  Genitourinary: Negative for bladder incontinence, dysuria and hematuria.  Musculoskeletal: Positive for back pain and neck pain.  Neurological: Negative for dizziness, weakness and headaches.  Hematological: Negative for adenopathy.  Psychiatric/Behavioral: Negative for dysphoric mood and sleep disturbance. The patient is nervous/anxious. The patient does not have insomnia.     Patient Active Problem List   Diagnosis Date Noted  . S/P abdominal hysterectomy and right salpingo-oophorectomy 07/31/2020  . 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 Head trauma 05/04/2019  . Family history of breast cancer 09/07/2018    No Known Allergies  Past Surgical History:  Procedure Laterality Date  . ABDOMINAL HYSTERECTOMY    . c sections    . CESAREAN SECTION    . LUNG SURGERY      Social History  Tobacco Use  . Smoking status: Former Smoker    Packs/day: 1.00    Years: 30.00    Pack years: 30.00    Types: Cigarettes  . Smokeless tobacco: Never Used  Vaping Use  . Vaping Use: Never used  Substance Use Topics  . Alcohol use: Yes    Comment: social  . Drug use: Never     Medication list has been reviewed and updated.  Current Meds  Medication Sig  . ALPRAZolam (XANAX) 0.5  MG tablet Take 0.25 mg by mouth as needed.   . cyclobenzaprine (FLEXERIL) 5 MG tablet Take 5 mg by mouth 3 (three) times daily as needed for muscle spasms.  Marland Kitchen estradiol-norethindrone (ACTIVELLA) 1-0.5 MG tablet Take 1 tablet by mouth daily.    PHQ 2/9 Scores 07/31/2020  PHQ - 2 Score 2  PHQ- 9 Score 2    No flowsheet data found.  BP Readings from Last 3 Encounters:  07/31/20 118/62  01/03/20 100/70  05/04/19 130/75    Physical Exam Vitals and nursing note reviewed.  Constitutional:      General: She is not in acute distress.    Appearance: She is well-developed.  HENT:     Head: Normocephalic and atraumatic.  Cardiovascular:     Rate and Rhythm: Normal rate and regular rhythm.     Pulses: Normal pulses.     Heart sounds: No murmur heard.   Pulmonary:     Effort: Pulmonary effort is normal. No respiratory distress.     Breath sounds: No wheezing or rhonchi.  Abdominal:     Tenderness: There is no right CVA tenderness or left CVA tenderness.     Comments: Tender just under the left posterior/lateral ribs - no bruising or mass appreicated  Musculoskeletal:     Cervical back: Decreased range of motion.     Thoracic back: Scoliosis present.     Lumbar back: Tenderness present. No swelling or spasms. Negative right straight leg raise test and negative left straight leg raise test.       Back:     Comments: Mild S-shaped scoliosis of thoracic and lumbar spine  Lymphadenopathy:     Cervical: No cervical adenopathy.  Skin:    General: Skin is warm and dry.     Capillary Refill: Capillary refill takes less than 2 seconds.     Findings: No rash.  Neurological:     General: No focal deficit present.     Mental Status: She is alert and oriented to person, place, and time.  Psychiatric:        Mood and Affect: Mood normal.        Behavior: Behavior normal.     Wt Readings from Last 3 Encounters:  07/31/20 147 lb (66.7 kg)  01/03/20 151 lb (68.5 kg)  05/04/19 145 lb 4 oz  (65.9 kg)    BP 118/62   Pulse 80   Ht 5\' 7"  (1.702 m)   Wt 147 lb (66.7 kg)   SpO2 98%   BMI 23.02 kg/m   Assessment and Plan: 1. Pain in right lumbar region of back From chronic overuse caring for elderly parents Recommend xrays, Mobic daily, flexeril tid, heat/ice Would also benefit from PTx but patient does not feel that she has the time - meloxicam (MOBIC) 15 MG tablet; Take 1 tablet (15 mg total) by mouth daily.  Dispense: 30 tablet; Refill: 0 - DG Lumbar Spine Complete; Future  2. Neck pain, chronic For many years; treated  with PRN flexeril and advil  3. Generalized anxiety disorder On low dose xanax PRN - #30 lasts about 4 months  4. S/P total hysterectomy Followed by GYN Pap last done in 2018  5. Flank pain No obvious mass but very tender Few RBC on micro but large on dipstick If no improvement in symptoms, will consider Renal US - POCT urinalysis dipstick   Partially dictated using Animal nutritionist. Any errors are unintentional.  Bari Edward, MD [facility]  07/31/2020

## 2020-08-28 ENCOUNTER — Other Ambulatory Visit: Payer: Self-pay | Admitting: Internal Medicine

## 2020-08-28 DIAGNOSIS — M545 Low back pain, unspecified: Secondary | ICD-10-CM

## 2020-10-12 ENCOUNTER — Other Ambulatory Visit: Payer: Self-pay | Admitting: Obstetrics and Gynecology

## 2020-10-12 DIAGNOSIS — Z79890 Hormone replacement therapy (postmenopausal): Secondary | ICD-10-CM

## 2020-10-18 ENCOUNTER — Telehealth: Payer: Self-pay

## 2020-10-18 NOTE — Telephone Encounter (Signed)
Please advise 

## 2020-10-18 NOTE — Telephone Encounter (Signed)
Called patient and left a voicemail message to see if the patient could come in February 8th at 2:40 for her physical.

## 2020-10-18 NOTE — Telephone Encounter (Signed)
Copied from CRM [id number]. Topic: General - Other >> Oct 18, 2020  2:06 PM Wyonia Hough E wrote: Reason for CRM: Pt called to reschedule her CPE and stated that she needs an appt to be after 11am due to that is when she has coverage for her dads care/ I advised her of no Cpe's in the afternoon as told by the [facility] / Pt was a bit upset and stated "I guess i'll have to wait until my father dies to get a physical" Pt states she is not able to do mornings due to this/ and she decided to cancel er appt please advise

## 2020-10-19 ENCOUNTER — Encounter: Payer: 59 | Admitting: Internal Medicine

## 2020-10-23 ENCOUNTER — Other Ambulatory Visit: Payer: Self-pay | Admitting: Internal Medicine

## 2020-10-23 DIAGNOSIS — Z1231 Encounter for screening mammogram for malignant neoplasm of breast: Secondary | ICD-10-CM

## 2020-10-30 ENCOUNTER — Other Ambulatory Visit: Payer: Self-pay

## 2020-10-30 ENCOUNTER — Ambulatory Visit
Admission: RE | Admit: 2020-10-30 | Discharge: 2020-10-30 | Disposition: A | Discharge status: Home / Self Care | Payer: 59 | Source: Ambulatory Visit | Attending: Internal Medicine | Admitting: Internal Medicine

## 2020-10-30 DIAGNOSIS — Z1231 Encounter for screening mammogram for malignant neoplasm of breast: Secondary | ICD-10-CM

## 2020-12-07 ENCOUNTER — Encounter: Payer: 59 | Admitting: Internal Medicine

## 2020-12-28 ENCOUNTER — Ambulatory Visit
Admission: EM | Admit: 2020-12-28 | Discharge: 2020-12-28 | Disposition: A | Discharge status: Home / Self Care | Payer: 59 | Attending: Family Medicine | Admitting: Family Medicine

## 2020-12-28 ENCOUNTER — Other Ambulatory Visit: Payer: Self-pay

## 2020-12-28 DIAGNOSIS — T24212A Burn of second degree of left thigh, initial encounter: Secondary | ICD-10-CM | POA: Diagnosis not present

## 2020-12-28 MED ORDER — SILVER SULFADIAZINE 1 % EX CREA: 1.0000 "application " | TOPICAL_CREAM | Freq: Every day | CUTANEOUS | 1 refills | Status: DC

## 2020-12-28 MED ORDER — HYDROCODONE-ACETAMINOPHEN 5-325 MG PO TABS: 1.0000 | ORAL_TABLET | Freq: Three times a day (TID) | ORAL | 0 refills | Status: DC | PRN

## 2020-12-28 NOTE — Discharge Instructions (Signed)
Go to [facility] if you have any trouble/concerns.  Medication as prescribed.  Take care  Dr. Adriana Simas

## 2020-12-28 NOTE — ED Provider Notes (Signed)
[facility]    CSN: [id number] Arrival date & time: 12/28/20  6837      History   Chief Complaint Chief Complaint  Patient presents with  . Burn   HPI  60 year old female presents with a burn.  Patient states that she was making coffee this morning.  She states that she accidentally got hot coffee grounds on her.  She has suffered a burn to the left thigh.  She has some small areas on her left foot and right ankle.  She reports that her pain is severe.  Her burn affects the majority of the anterior surface of her left thigh.  There is some evidence of blistering.  She has applied a cool pack with some improvement in the pain.  No other injuries.  No other complaints.  Past Medical History:  Diagnosis Date  . Arthritis   . Asthma   . Lung blebs (HCC)   . Mixed incontinence   . Pneumothorax   . Visual disturbance     Patient Active Problem List   Diagnosis Date Noted  . Neck pain, chronic 07/31/2020  . S/P total hysterectomy 07/31/2020  . 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 Head trauma 05/04/2019  . Family history of breast cancer 09/07/2018    Past Surgical History:  Procedure Laterality Date  . ABDOMINAL HYSTERECTOMY    . c sections    . CESAREAN SECTION    . LUNG SURGERY      OB History    Gravida  2   Para  2   Term  2   Preterm      AB      Living  2     SAB      IAB      Ectopic      Multiple      Live Births  2            Home Medications    Prior to Admission medications   Medication Sig Start Date End Date Taking? Authorizing Provider  HYDROcodone-acetaminophen (NORCO/VICODIN) 5-325 MG tablet Take 1 tablet by mouth every 8 (eight) hours as needed for moderate pain or severe pain. 12/28/20  Yes Markis Langland G, DO  silver sulfADIAZINE (SILVADENE) 1 % cream Apply 1 application topically daily. 12/28/20  Yes Keiasha Diep G, DO  ALPRAZolam (XANAX) 0.5 MG tablet Take 0.25  mg by mouth as needed.     [provider]  cyclobenzaprine (FLEXERIL) 5 MG tablet Take 5 mg by mouth 3 (three) times daily as needed for muscle spasms.    [provider]  estradiol-norethindrone (ACTIVELLA) 1-0.5 MG tablet Take 1 tablet by mouth daily. 01/03/20   Copland, Ilona Sorrel, PA-C  meloxicam (MOBIC) 15 MG tablet Take 1 tablet (15 mg total) by mouth daily. 08/28/20   Reubin Milan, MD    Family History Family History  Problem Relation Age of Onset  . Diabetes Mother        DM Type 2  . Stroke Mother   . Breast cancer Mother 70       triple neg  . Heart disease Mother   . Hypertension Mother   . Alzheimer's disease Mother   . Heart disease Father   . Hypertension Father   . Diabetes Father        DM Type 2  . Transient ischemic attack Father   . Breast cancer Maternal Aunt 64  .  Lung cancer Cousin   . Breast cancer Other   . Breast cancer Maternal Grandmother     Social History Social History   Tobacco Use  . Smoking status: Former Smoker    Packs/day: 1.00    Years: 30.00    Pack years: 30.00    Types: Cigarettes  . Smokeless tobacco: Never Used  Vaping Use  . Vaping Use: Never used  Substance Use Topics  . Alcohol use: Yes    Comment: social  . Drug use: Never     Allergies   Patient has no known allergies.   Review of Systems Review of Systems  Constitutional: Negative.   Skin:       Burn.     Physical Exam Triage Vital Signs ED Triage Vitals  Enc Vitals Group     BP 12/28/20 0942 123/89     Pulse Rate 12/28/20 0942 75     Resp 12/28/20 0942 18     Temp 12/28/20 0942 98.2 F (36.8 C)     Temp Source 12/28/20 0942 Oral     SpO2 12/28/20 0942 100 %     Weight 12/28/20 0942 147 lb 0.8 oz (66.7 kg)     Height 12/28/20 0942 5\' 7"  (1.702 m)     Head Circumference --      Peak Flow --      Pain Score 12/28/20 0941 10     Pain Loc --      Pain Edu? --      Excl. in GC? --    No data found.  Updated Vital Signs BP  123/89 (BP Location: Right Arm)   Pulse 75   Temp 98.2 F (36.8 C) (Oral)   Resp 18   Ht 5\' 7"  (1.702 m)   Wt 66.7 kg   SpO2 100%   BMI 23.03 kg/m   Visual Acuity Right Eye Distance:   Left Eye Distance:   Bilateral Distance:    Right Eye Near:   Left Eye Near:    Bilateral Near:     Physical Exam Constitutional:      General: She is not in acute distress.    Appearance: Normal appearance.  HENT:     Head: Normocephalic and atraumatic.  Cardiovascular:     Rate and Rhythm: Normal rate and regular rhythm.  Pulmonary:     Effort: Pulmonary effort is normal.     Breath sounds: Normal breath sounds.  Skin:    Comments: Left anterior thigh with diffuse erythema which blanches.  Good cap refill.  There is some evidence of blistering.  Burn affects essentially the entire anterior surface of the thigh.  She has a few scattered areas on her left foot and right ankle.  Neurological:     Mental Status: She is alert.  Psychiatric:        Mood and Affect: Mood normal.        Behavior: Behavior normal.    UC Treatments / Results  Labs (all labs ordered are listed, but only abnormal results are displayed) Labs Reviewed - No data to display  EKG   Radiology No results found.  Procedures Procedures (including critical care time)  Medications Ordered in UC Medications - No data to display  Initial Impression / Assessment and Plan / UC Course  I have reviewed the triage vital signs and the nursing notes.  Pertinent labs & imaging results that were available during my care of the patient were reviewed by me and  considered in my medical decision making (see chart for details).    60 year old female presents with a partial-thickness burn.  We discussed going to [facility] burn unit versus outpatient care.  She elected for the latter.  Silvadene was applied in clinic today.  Sending home on Silvadene.  Pain medication as needed.  I advised her to go to the hospital if she fails  improve or worsens or her pain is not controlled.  Final Clinical Impressions(s) / UC Diagnoses   Final diagnoses:  Partial thickness burn of left thigh, initial encounter     Discharge Instructions     Go to [facility] if you have any trouble/concerns.  Medication as prescribed.  Take care  Dr. Adriana Simas    ED Prescriptions    Medication Sig Dispense Auth. Provider   silver sulfADIAZINE (SILVADENE) 1 % cream Apply 1 application topically daily. 400 g Malyk Girouard G, DO   HYDROcodone-acetaminophen (NORCO/VICODIN) 5-325 MG tablet Take 1 tablet by mouth every 8 (eight) hours as needed for moderate pain or severe pain. 10 tablet Everlene Other G, DO     I have reviewed the PDMP during this encounter.   Tommie Sams, [place] 12/28/20 1008

## 2020-12-28 NOTE — ED Triage Notes (Signed)
Patient states that she was making coffee this morning and it spilled and went all over her left leg. Patient with burn to thigh, left foot and right ankle.

## 2021-01-02 ENCOUNTER — Other Ambulatory Visit: Payer: Self-pay | Admitting: Obstetrics and Gynecology

## 2021-01-02 DIAGNOSIS — Z79890 Hormone replacement therapy (postmenopausal): Secondary | ICD-10-CM

## 2021-01-29 ENCOUNTER — Other Ambulatory Visit: Payer: Self-pay | Admitting: Obstetrics and Gynecology

## 2021-01-29 DIAGNOSIS — Z79890 Hormone replacement therapy (postmenopausal): Secondary | ICD-10-CM

## 2021-02-02 MED ORDER — ESTRADIOL-NORETHINDRONE ACET 1-0.5 MG PO TABS: 1.0000 | ORAL_TABLET | Freq: Every day | ORAL | 0 refills | Status: DC

## 2021-02-02 NOTE — Telephone Encounter (Signed)
Patient has scheduled AE for 02/15/21. Requesting refill of HRT. GT#[phone]

## 2021-02-02 NOTE — Telephone Encounter (Signed)
1 rf sent. Patient aware.

## 2021-02-02 NOTE — Addendum Note (Signed)
Addended by: Kathlene Cote on: 02/02/2021 02:54 PM   Modules accepted: Orders

## 2021-02-15 ENCOUNTER — Ambulatory Visit (INDEPENDENT_AMBULATORY_CARE_PROVIDER_SITE_OTHER): Payer: 59 | Admitting: Obstetrics and Gynecology

## 2021-02-15 ENCOUNTER — Encounter: Payer: Self-pay | Admitting: Obstetrics and Gynecology

## 2021-02-15 ENCOUNTER — Other Ambulatory Visit: Payer: Self-pay

## 2021-02-15 VITALS — BP 102/66 | Ht 67.0 in | Wt 152.0 lb

## 2021-02-15 DIAGNOSIS — Z803 Family history of malignant neoplasm of breast: Secondary | ICD-10-CM

## 2021-02-15 DIAGNOSIS — Z79890 Hormone replacement therapy (postmenopausal): Secondary | ICD-10-CM

## 2021-02-15 DIAGNOSIS — Z1231 Encounter for screening mammogram for malignant neoplasm of breast: Secondary | ICD-10-CM | POA: Diagnosis not present

## 2021-02-15 DIAGNOSIS — Z01419 Encounter for gynecological examination (general) (routine) without abnormal findings: Secondary | ICD-10-CM | POA: Diagnosis not present

## 2021-02-15 MED ORDER — ESTRADIOL-NORETHINDRONE ACET 1-0.5 MG PO TABS: 1.0000 | ORAL_TABLET | Freq: Every day | ORAL | 3 refills | Status: DC

## 2021-02-15 NOTE — Patient Instructions (Signed)
I value your feedback and you entrusting us with your care. If you get a [facility] patient survey, I would appreciate you taking the time to let us know about your experience today. Thank you! ? ? ?

## 2021-02-15 NOTE — Progress Notes (Addendum)
PCP: Reubin Milan, MD   Chief Complaint  Patient presents with  . Gynecologic Exam    Boil near coccyx bone, painful    HPI:      [name] is a 60 y.o. [id number] who LMP was No LMP recorded. Patient has had a hysterectomy., presents today for her annual examination.  Her menses are absent due to TAH for CPP yrs ago. She does not have postmenopausal bleeding.  She does have vasomotor sx. She uses activella with some sx improvement. No side effects. Wants to cont meds.   Sex activity: single partner, contraception - status post hysterectomy. She does have vaginal dryness, uses lubricants with relief.  Last Pap: 06/17/17  Results were: no abnormalities /neg HPV DNA. No longer indicated Hx of STDs: none  Last mammogram: 10/30/20  Results were: normal--routine follow-up in 12 months.  There is a FH of breast cancer in her mother (triple neg), mat aunt, and mat 2nd cousins, as well as PGM. Genetic testing not done, declined by pt several times. There is no FH of ovarian cancer. The patient does do self-breast exams.  Colonoscopy: colonoscopy 8 years ago without abnormalities. Pt followed by PCP for colonoscopy sched.   Having issues with painful area along coccyx  intermittently for several months. Sometimes gets really large and painful, hurts to sit. Has never drained. Then calms down. Sx getting more frequent.  Tobacco use: The patient denies current or previous tobacco use. Alcohol use: social drinker  No drug use. Exercise: moderately active  She does get adequate calcium and Vitamin D in her diet.  Still having issues with SUI, incontinence with walking and during sex. Saw Dr. Tiburcio Pea in past who suggested surgery   Labs with PCP   Past Medical History:  Diagnosis Date  . Arthritis   . Asthma   . Lung blebs (HCC)   . Mixed incontinence   . Pneumothorax   . Visual disturbance     Past Surgical History:  Procedure Laterality Date  . ABDOMINAL HYSTERECTOMY     . c sections    . CESAREAN SECTION    . LUNG SURGERY      Family History  Problem Relation Age of Onset  . Diabetes Mother        DM Type 2  . Stroke Mother   . Breast cancer Mother 8       triple neg  . Heart disease Mother   . Hypertension Mother   . Alzheimer's disease Mother   . Heart disease Father   . Hypertension Father   . Diabetes Father        DM Type 2  . Transient ischemic attack Father   . Breast cancer Maternal Aunt 64  . Lung cancer Cousin   . Breast cancer Other   . Breast cancer Paternal Grandmother     Social History   Socioeconomic History  . Marital status: Married    Spouse name: Not on file  . Number of children: 2  . Years of education: college  . Highest education level: Not on file  Occupational History  . Occupation: Visual merchandiser  Tobacco Use  . Smoking status: Former Smoker    Packs/day: 1.00    Years: 30.00    Pack years: 30.00    Types: Cigarettes  . Smokeless tobacco: Never Used  Vaping Use  . Vaping Use: Never used  Substance and Sexual Activity  . Alcohol use: Yes  Comment: social  . Drug use: Never  . Sexual activity: Yes    Birth control/protection: Surgical    Comment: Hysterectomy  Other Topics Concern  . Not on file  Social History Narrative   Lives at home with her husband.  Her parents live with her right now.  She is their caregiver.   Two cups coffee, one soda per day.   Right-handed.   Social Determinants of Health   Financial Resource Strain: Not on file  Food Insecurity: Not on file  Transportation Needs: Not on file  Physical Activity: Not on file  Stress: Not on file  Social Connections: Not on file  Intimate Partner Violence: Not on file    Current Meds  Medication Sig  . ALPRAZolam (XANAX) 0.5 MG tablet Take 0.25 mg by mouth as needed.   . cyclobenzaprine (FLEXERIL) 5 MG tablet Take 5 mg by mouth 3 (three) times daily as needed for muscle spasms.  . silver sulfADIAZINE (SILVADENE) 1 %  cream Apply 1 application topically daily.  . [DISCONTINUED] estradiol-norethindrone (ACTIVELLA) 1-0.5 MG tablet Take 1 tablet by mouth daily.     ROS:  Review of Systems  Constitutional: Negative for fatigue, fever and unexpected weight change.  Respiratory: Negative for cough, shortness of breath and wheezing.   Cardiovascular: Negative for chest pain, palpitations and leg swelling.  Gastrointestinal: Negative for blood in stool, constipation, diarrhea, nausea and vomiting.  Endocrine: Negative for cold intolerance, heat intolerance and polyuria.  Genitourinary: Negative for dyspareunia, dysuria, flank pain, frequency, genital sores, hematuria, menstrual problem, pelvic pain, urgency, vaginal bleeding, vaginal discharge and vaginal pain.  Musculoskeletal: Negative for back pain, joint swelling and myalgias.  Skin: Negative for color change and rash.  Neurological: Negative for dizziness, syncope, light-headedness, numbness and headaches.  Hematological: Negative for adenopathy.  Psychiatric/Behavioral: Negative for agitation, confusion, sleep disturbance and suicidal ideas. The patient is not nervous/anxious.      Objective: BP 102/66   Ht 5\' 7"  (1.702 m)   Wt 152 lb (68.9 kg)   BMI 23.81 kg/m    Physical Exam Constitutional:      Appearance: She is well-developed.  Genitourinary:     Vulva normal.     Genitourinary Comments: UTERUS/CX SURG REM     Right Labia: No rash, tenderness or lesions.    Left Labia: No tenderness, lesions or rash.    No vaginal discharge, erythema or tenderness.      Right Adnexa: not tender and no mass present.    Left Adnexa: not tender and no mass present.    No cervical friability or polyp.     Uterus is not enlarged or tender.  Breasts:     Right: No mass, nipple discharge, skin change or tenderness.     Left: No mass, nipple discharge, skin change or tenderness.    Neck:     Thyroid: No thyromegaly.  Cardiovascular:     Rate and  Rhythm: Normal rate and regular rhythm.     Heart sounds: Normal heart sounds. No murmur heard.   Pulmonary:     Effort: Pulmonary effort is normal.     Breath sounds: Normal breath sounds.  Abdominal:     Palpations: Abdomen is soft.     Tenderness: There is no abdominal tenderness. There is no guarding or rebound.  Musculoskeletal:        General: Normal range of motion.     Cervical back: Normal range of motion.  Lymphadenopathy:  Cervical: No cervical adenopathy.  Neurological:     General: No focal deficit present.     Mental Status: She is alert and oriented to person, place, and time.     Cranial Nerves: No cranial nerve deficit.  Skin:    General: Skin is warm and dry.  Psychiatric:        Mood and Affect: Mood normal.        Behavior: Behavior normal.        Thought Content: Thought content normal.        Judgment: Judgment normal.  Vitals reviewed.     Assessment/Plan:  Encounter for annual routine gynecological examination  Encounter for screening mammogram for malignant neoplasm of breast - Plan: MM 3D SCREEN BREAST BILATERAL; pt current on mammo  Family history of breast cancer - Plan: MM 3D SCREEN BREAST BILATERAL; MyRisk testing discussed. Pt to f/u if desires.   Hormone replacement therapy (HRT) - Rx RF activella. F/u prn. - Plan: estradiol-norethindrone (ACTIVELLA) 1-0.5 MG tablet  Pilonidal cyst--discussed gen surg ref, declines for now. Pt will f/u if sx recur. Sitz baths, may need abx.    Meds ordered this encounter  Medications  . estradiol-norethindrone (ACTIVELLA) 1-0.5 MG tablet    Sig: Take 1 tablet by mouth daily.    Dispense:  90 tablet    Refill:  3    Order Specific Question:   Supervising Provider    Answer:   Nadara Mustard [[id number]]            GYN counsel breast self exam, mammography screening, adequate intake of calcium and vitamin D, diet and exercise    F/U  Return in about 1 year (around 02/15/2022).  Chloie Loney B. Deandrea Rion,  PA-C 02/15/2021 3:34 PM

## 2021-02-16 ENCOUNTER — Other Ambulatory Visit: Payer: Self-pay | Admitting: Advanced Practice Midwife

## 2021-02-16 ENCOUNTER — Telehealth: Payer: Self-pay

## 2021-02-16 DIAGNOSIS — L0591 Pilonidal cyst without abscess: Secondary | ICD-10-CM

## 2021-02-16 MED ORDER — CEPHALEXIN 500 MG PO CAPS: 500.0000 mg | ORAL_CAPSULE | Freq: Three times a day (TID) | ORAL | 0 refills | Status: AC

## 2021-02-16 NOTE — Telephone Encounter (Signed)
Pt calling; was seen yesterday and an area on her bum was ck'd; was told if it flaired up to call for antibx to be called in.  It has flaired up.  [phone]

## 2021-02-16 NOTE — Progress Notes (Signed)
Rx keflex per request from patient- was seen yesterday by ABC.

## 2021-02-16 NOTE — Telephone Encounter (Signed)
Antibiotic sent and patient aware

## 2021-10-10 ENCOUNTER — Other Ambulatory Visit: Payer: Self-pay | Admitting: Internal Medicine

## 2021-10-15 DIAGNOSIS — S29012A Strain of muscle and tendon of back wall of thorax, initial encounter: Secondary | ICD-10-CM | POA: Insufficient documentation

## 2021-10-19 ENCOUNTER — Ambulatory Visit: Payer: Self-pay

## 2021-10-19 NOTE — Telephone Encounter (Signed)
° ° °  Chief Complaint: Back pain between shoulders Symptoms: Pain Frequency: Started 2 weeks ago. Seen in [facility] Pertinent Negatives: Patient denies weakness Disposition: [] ED /[] Urgent Care (no appt availability in office) / [] Appointment(In office/virtual)/ []  [facility]/ [] Home Care/ [] Refused Recommended Disposition /[] [facility]/ []  Follow-up with PCP Additional Notes: No availability today. Pt. States she cannot wait until Monday. Will go to ED.  Reason for Disposition  [1] SEVERE back pain (e.g., excruciating, unable to do any normal activities) AND [2] not improved 2 hours after pain medicine  Answer Assessment - Initial Assessment Questions 1. ONSET: "When did the pain begin?"      2 weeks ago 2. LOCATION: "Where does it hurt?" (upper, mid or lower back)     Between shoulders 3. SEVERITY: "How bad is the pain?"  (e.g., Scale 1-10; mild, moderate, or severe)   - MILD (1-3): doesn't interfere with normal activities    - MODERATE (4-7): interferes with normal activities or awakens from sleep    - SEVERE (8-10): excruciating pain, unable to do any normal activities      Now - 7 4. PATTERN: "Is the pain constant?" (e.g., yes, no; constant, intermittent)      Constant in mornings 5. RADIATION: "Does the pain shoot into your legs or elsewhere?"     Chest 6. CAUSE:  "What do you think is causing the back pain?"      Unsure 7. BACK OVERUSE:  "Any recent lifting of heavy objects, strenuous work or exercise?"     No 8. MEDICATIONS: "What have you taken so far for the pain?" (e.g., nothing, acetaminophen, NSAIDS)     Muscle relaxer 9. NEUROLOGIC SYMPTOMS: "Do you have any weakness, numbness, or problems with bowel/bladder control?"     Sore on right side 10. OTHER SYMPTOMS: "Do you have any other symptoms?" (e.g., fever, abdominal pain, burning with urination, blood in urine)       No 11. PREGNANCY: "Is there any chance you are pregnant?" (e.g., yes, no;  LMP)       No  Protocols used: Back Pain-A-AH

## 2021-10-29 ENCOUNTER — Ambulatory Visit (INDEPENDENT_AMBULATORY_CARE_PROVIDER_SITE_OTHER): Payer: 59 | Admitting: Internal Medicine

## 2021-10-29 ENCOUNTER — Other Ambulatory Visit: Payer: Self-pay

## 2021-10-29 ENCOUNTER — Encounter: Payer: Self-pay | Admitting: Internal Medicine

## 2021-10-29 ENCOUNTER — Other Ambulatory Visit
Admission: RE | Admit: 2021-10-29 | Discharge: 2021-10-29 | Disposition: A | Discharge status: Home / Self Care | Payer: 59 | Attending: Internal Medicine | Admitting: Internal Medicine

## 2021-10-29 VITALS — BP 118/78 | HR 78 | Ht 67.0 in | Wt 154.0 lb

## 2021-10-29 DIAGNOSIS — Z01812 Encounter for preprocedural laboratory examination: Secondary | ICD-10-CM | POA: Insufficient documentation

## 2021-10-29 DIAGNOSIS — R0789 Other chest pain: Secondary | ICD-10-CM

## 2021-10-29 DIAGNOSIS — J432 Centrilobular emphysema: Secondary | ICD-10-CM | POA: Diagnosis not present

## 2021-10-29 DIAGNOSIS — Z1159 Encounter for screening for other viral diseases: Secondary | ICD-10-CM | POA: Insufficient documentation

## 2021-10-29 DIAGNOSIS — M546 Pain in thoracic spine: Secondary | ICD-10-CM | POA: Diagnosis not present

## 2021-10-29 LAB — LIPID PANEL
Cholesterol: 253 mg/dL — ABNORMAL HIGH (ref 0–200)
HDL: 42 mg/dL (ref >40)
LDL Cholesterol: 162 mg/dL — ABNORMAL HIGH (ref 0–99)
Total CHOL/HDL Ratio: 6 RATIO
Triglycerides: 246 mg/dL — ABNORMAL HIGH (ref <150)
VLDL: 49 mg/dL — ABNORMAL HIGH (ref 0–40)

## 2021-10-29 LAB — CBC WITH DIFFERENTIAL/PLATELET
Abs Immature Granulocytes: 0.03 10*3/uL (ref 0.00–0.07)
Basophils Absolute: 0 10*3/uL (ref 0.0–0.1)
Basophils Relative: 0 %
Eosinophils Absolute: 0.3 10*3/uL (ref 0.0–0.5)
Eosinophils Relative: 2 %
HCT: 40.6 % (ref 36.0–46.0)
Hemoglobin: 13.8 g/dL (ref 12.0–15.0)
Immature Granulocytes: 0 %
Lymphocytes Relative: 17 %
Lymphs Abs: 1.8 10*3/uL (ref 0.7–4.0)
MCH: 30.4 pg (ref 26.0–34.0)
MCHC: 34 g/dL (ref 30.0–36.0)
MCV: 89.4 fL (ref 80.0–100.0)
Monocytes Absolute: 0.8 10*3/uL (ref 0.1–1.0)
Monocytes Relative: 8 %
Neutro Abs: 7.5 10*3/uL (ref 1.7–7.7)
Neutrophils Relative %: 73 %
Platelets: 292 10*3/uL (ref 150–400)
RBC: 4.54 MIL/uL (ref 3.87–5.11)
RDW: 12.5 % (ref 11.5–15.5)
WBC: 10.4 10*3/uL (ref 4.0–10.5)
nRBC: 0 % (ref 0.0–0.2)

## 2021-10-29 LAB — COMPREHENSIVE METABOLIC PANEL
ALT: 18 U/L (ref 0–44)
AST: 15 U/L (ref 15–41)
Albumin: 4.4 g/dL (ref 3.5–5.0)
Alkaline Phosphatase: 33 U/L — ABNORMAL LOW (ref 38–126)
Anion gap: 8 (ref 5–15)
BUN: 21 mg/dL — ABNORMAL HIGH (ref 6–20)
CO2: 22 mmol/L (ref 22–32)
Calcium: 9.1 mg/dL (ref 8.9–10.3)
Chloride: 102 mmol/L (ref 98–111)
Creatinine, Ser: 0.79 mg/dL (ref 0.44–1.00)
GFR, Estimated: >60 mL/min (ref >60)
Glucose, Bld: 99 mg/dL (ref 70–99)
Potassium: 3.7 mmol/L (ref 3.5–5.1)
Sodium: 132 mmol/L — ABNORMAL LOW (ref 135–145)
Total Bilirubin: 0.6 mg/dL (ref 0.3–1.2)
Total Protein: 7.5 g/dL (ref 6.5–8.1)

## 2021-10-29 LAB — T4, FREE: Free T4: 1.05 ng/dL (ref 0.61–1.12)

## 2021-10-29 LAB — HEPATITIS C ANTIBODY: HCV Ab: NONREACTIVE

## 2021-10-29 LAB — SEDIMENTATION RATE: Sed Rate: 7 mm/hr (ref 0–30)

## 2021-10-29 LAB — TSH: TSH: 1.088 u[IU]/mL (ref 0.350–4.500)

## 2021-10-29 MED ORDER — PREDNISONE 10 MG PO TABS: 10.0000 mg | ORAL_TABLET | ORAL | 0 refills | Status: AC

## 2021-10-29 NOTE — Progress Notes (Signed)
Date:  10/29/2021   Name:  Cassidy Suarez   DOB:  [birth year]   MRN:  [id number]   Chief Complaint: Chest Pain and Shortness of Breath  Chest Pain  This is a new problem. The current episode started 1 to 4 weeks ago. The problem occurs daily. The pain is present in the lateral region. The pain is moderate. The quality of the pain is described as squeezing and tightness. The pain does not radiate (comes around from the thoracic spine and under shoulder blade). Associated symptoms include back pain, shortness of breath and weakness (possible mild RUE weakness). Pertinent negatives include no cough, diaphoresis, dizziness, fever, headaches or palpitations.  Shortness of Breath This is a recurrent problem. Episode onset: has had cardiac stress testing as far back as 2011. The problem occurs intermittently. The problem has been unchanged. Associated symptoms include chest pain and neck pain (left side). Pertinent negatives include no fever, headaches, leg swelling or wheezing. Exacerbated by: severe pain. The patient has no known risk factors for DVT/PE.  Back Pain This is a new problem. The problem occurs constantly. The problem has been waxing and waning since onset. The pain is present in the thoracic spine. The quality of the pain is described as aching. Radiates to: right chest. The pain is moderate. The pain is Worse during the day. Stiffness is present In the morning. Associated symptoms include chest pain and weakness (possible mild RUE weakness). Pertinent negatives include no fever, headaches or tingling. She has tried muscle relaxant and NSAIDs (and steroid taper gave a few days of relief) for the symptoms.   Per [facility] Note 10/29/21 The patient continues to have pain and weakness in her upper extremities and arms. The patient will be given gabapentin as well as an anti-inflammatory and may continue taking Tylenol. Because of the complaints of somewhat shortness of breath and the chest  pain complaints I told her I think her primary care should be involved because of chest pain and SOB complaints. The patient will reach out to her primary care for evaluation of other possible causes of pain.    Lab Results  Component Value Date   NA 139 05/04/2019   K 3.9 05/04/2019   CO2 25 05/04/2019   GLUCOSE 109 (H) 05/04/2019   BUN 12 05/04/2019   CREATININE 0.90 05/04/2019   CALCIUM 9.7 05/04/2019   GFRNONAA 72 05/04/2019   No results found for: CHOL, HDL, LDLCALC, LDLDIRECT, TRIG, CHOLHDL Lab Results  Component Value Date   TSH 1.960 05/04/2019   No results found for: HGBA1C Lab Results  Component Value Date   WBC 12.2 (H) 05/04/2019   HGB 13.9 05/04/2019   HCT 40.4 05/04/2019   MCV 88 05/04/2019   PLT 280 05/04/2019   Lab Results  Component Value Date   ALT 10 05/04/2019   AST 12 05/04/2019   ALKPHOS 41 05/04/2019   BILITOT 0.5 05/04/2019   No results found for: 25OHVITD2, 25OHVITD3, VD25OH   Review of Systems  Constitutional:  Negative for chills, diaphoresis and fever.  HENT:  Negative for trouble swallowing.   Respiratory:  Positive for shortness of breath. Negative for cough, chest tightness and wheezing.   Cardiovascular:  Positive for chest pain. Negative for palpitations and leg swelling.  Musculoskeletal:  Positive for back pain, neck pain (left side) and neck stiffness.  Neurological:  Positive for weakness (possible mild RUE weakness). Negative for dizziness, tingling, light-headedness and headaches.  Psychiatric/Behavioral:  Negative for  dysphoric mood and sleep disturbance. The patient is not nervous/anxious.    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  Current Meds  Medication Sig   ALPRAZolam (XANAX) 0.5 MG tablet Take 0.25 mg by mouth as needed.    cyclobenzaprine (FLEXERIL) 10 MG tablet Take 5 mg by mouth as needed.   estradiol-norethindrone (ACTIVELLA) 1-0.5 MG tablet Take 1 tablet by mouth daily.   [DISCONTINUED] silver sulfADIAZINE (SILVADENE) 1 % cream Apply 1 application topically daily.    PHQ 2/9 Scores 07/31/2020  PHQ - 2 Score 2  PHQ- 9 Score 2    No flowsheet data found.  BP Readings from Last 3 Encounters:  10/29/21 118/78  02/15/21 102/66  12/28/20 123/89    Physical Exam Constitutional:      General: She is in acute distress.  Cardiovascular:     Rate and Rhythm: Normal rate and regular rhythm. No extrasystoles are present.    Heart sounds: Normal heart sounds. No murmur heard.   No friction rub. No gallop.  Pulmonary:     Effort: Pulmonary effort is normal. No respiratory distress.     Breath sounds: Normal breath sounds. No stridor.  Chest:     Chest wall: Tenderness present. No mass, deformity, crepitus or edema.  Neurological:     Mental Status: She is alert.    Wt Readings from Last 3 Encounters:  10/29/21 154 lb (69.9 kg)  02/15/21 152 lb (68.9 kg)  12/28/20 147 lb 0.8 oz (66.7 kg)    BP 118/78    Pulse 78    Ht 5\' 7"  (1.702 m)    Wt 154 lb (69.9 kg)    SpO2 97%    BMI 24.12 kg/m   Assessment and Plan: 1. Atypical chest pain Appears to be neuropathic radiating around from the mid  right back No rash to suggest Zoster Has gabapentin but has not taken it yet. - CBC with Differential/Platelet - Comprehensive metabolic panel - Lipid panel - Sedimentation rate - TSH + free T4 - predniSONE (DELTASONE) 10 MG tablet; Take 1 tablet (10 mg total) by mouth as directed for 6 days. Take 6,5,4,3,2,1 then stop  Dispense: 21 tablet; Refill: 0   2. Centrilobular emphysema ([place]) Long standing known bullous emphysema Would like to get a CXR but imaging not covered by her insurance here Order for DDI - pt will call about coverage  3. Thoracic spine pain Seen by [facility] - agree with proceeding with MRI  4. Need for hepatitis C  screening test - Hepatitis C antibody   Partially dictated using Editor, commissioning. Any errors are unintentional.  Halina Maidens, MD [facility]  10/29/2021

## 2021-10-30 ENCOUNTER — Encounter: Payer: Self-pay | Admitting: Internal Medicine

## 2021-10-30 DIAGNOSIS — E782 Mixed hyperlipidemia: Secondary | ICD-10-CM | POA: Insufficient documentation

## 2021-11-14 ENCOUNTER — Ambulatory Visit: Payer: 59 | Admitting: Internal Medicine

## 2021-11-14 ENCOUNTER — Encounter: Payer: Self-pay | Admitting: Internal Medicine

## 2021-11-14 ENCOUNTER — Other Ambulatory Visit: Payer: Self-pay

## 2021-11-14 ENCOUNTER — Telehealth: Payer: Self-pay

## 2021-11-14 VITALS — BP 146/82 | HR 109 | Ht 67.0 in | Wt 159.0 lb

## 2021-11-14 DIAGNOSIS — M5124 Other intervertebral disc displacement, thoracic region: Secondary | ICD-10-CM

## 2021-11-14 DIAGNOSIS — I700 Atherosclerosis of aorta: Secondary | ICD-10-CM | POA: Insufficient documentation

## 2021-11-14 DIAGNOSIS — Z1231 Encounter for screening mammogram for malignant neoplasm of breast: Secondary | ICD-10-CM

## 2021-11-14 DIAGNOSIS — F411 Generalized anxiety disorder: Secondary | ICD-10-CM | POA: Diagnosis not present

## 2021-11-14 MED ORDER — ALPRAZOLAM 0.5 MG PO TABS: 0.2500 mg | ORAL_TABLET | ORAL | 1 refills | Status: DC | PRN

## 2021-11-14 MED ORDER — CYCLOBENZAPRINE HCL 10 MG PO TABS: 5.0000 mg | ORAL_TABLET | Freq: Two times a day (BID) | ORAL | 3 refills | Status: DC

## 2021-11-14 NOTE — Patient Instructions (Addendum)
T3-T4 interlaminar injection  ? ?[facility] - OB-GYN ?

## 2021-11-14 NOTE — Telephone Encounter (Signed)
Called pt. We did not order MRI so informed her we do not interpret these or follow her for her back pain. Told her she is supposed to continue to see [facility] for her back pain. ? ?Dr Judithann Graves did order a chest XR from [facility] and we have not gotten results for that. Called  diagnostics and waiting for faxed results. Will call pt back once received.  ?

## 2021-11-14 NOTE — Telephone Encounter (Signed)
Copied from CRM [id number]. Topic: General - Other ?>> Nov 14, 2021  9:56 AM McGill, Darlina Rumpf wrote: ?Reason for CRM: Pt stated she wants to know if PCP has seen and or received her MRI results from yesterday.  ? ?Pt asked to reschedule upcoming appointment for today at 4:00 PM. However, pt would like to make sure PCP has all information needed.  ? ?Pt requesting a call back if possible today before her appointment. ?

## 2021-11-14 NOTE — Progress Notes (Signed)
? ? ?Date:  11/14/2021  ? ?Name:  Cassidy Suarez   DOB:  [birth year]   MRN:  [id number] ? ? ?Chief Complaint: Chest Pain (Feels better medication is helping) and Back Pain ? ?Chest Pain  ?This is a new problem. The current episode started 1 to 4 weeks ago. The problem has been waxing and waning. The pain is present in the lateral region. The pain is moderate. Associated symptoms include back pain and shortness of breath (her back pain is so sharp at times that is takes her breath away). Pertinent negatives include no cough, dizziness, fever, headaches, palpitations or weakness.  ?Her past medical history is significant for COPD (mild hyperinflation on CXR).  ?Back Pain ?This is a chronic problem. The problem has been gradually improving since onset. The pain is present in the thoracic spine. The quality of the pain is described as burning, shooting and stabbing. Radiates to: right side and chest. The pain is moderate. Worse during: more severe in the early AM. Pertinent negatives include no fever, headaches or weakness. She has tried analgesics and muscle relaxant (and gabapentin) for the symptoms. The treatment provided mild relief.  ?She recently had MRI Thoracic spine which showed the following findings with Ortho recommendations. 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 This could be potentially causing some of her pain. She is going to obtain a chest x-ray to see if there is anything lung related. I do not think it is breathing related issues.  ?1. We could either offer a T3-T4 interlaminar injection to see how much relief she gets with it versus doing a right-sided medial scapular bursal injection as well.  ?2. She is going to follow up in a couple of days after she meets with her primary care doctor and we can reevaluate and see where she is going.  ?3. We can also consider doing a pain management referral for  RFAs.  ? ?Lab Results  ?Component Value Date  ? NA 132 (L) 10/29/2021  ? K 3.7 10/29/2021  ? CO2 22 10/29/2021  ? GLUCOSE 99 10/29/2021  ? BUN 21 (H) 10/29/2021  ? CREATININE 0.79 10/29/2021  ? CALCIUM 9.1 10/29/2021  ? GFRNONAA >60 10/29/2021  ? ?Lab Results  ?Component Value Date  ? CHOL 253 (H) 10/29/2021  ? HDL 42 10/29/2021  ? LDLCALC 162 (H) 10/29/2021  ? TRIG 246 (H) 10/29/2021  ? CHOLHDL 6.0 10/29/2021  ? ?Lab Results  ?Component Value Date  ? TSH 1.088 10/29/2021  ? ?No results found for: HGBA1C ?Lab Results  ?Component Value Date  ? WBC 10.4 10/29/2021  ? HGB 13.8 10/29/2021  ? HCT 40.6 10/29/2021  ? MCV 89.4 10/29/2021  ? PLT 292 10/29/2021  ? ?Lab Results  ?Component Value Date  ? ALT 18 10/29/2021  ? AST 15 10/29/2021  ? ALKPHOS 33 (L) 10/29/2021  ? BILITOT 0.6 10/29/2021  ? ?No results found for: 25OHVITD2, 25OHVITD3, VD25OH  ? ?Review of Systems  ?Constitutional:  Negative for chills, fatigue and fever.  ?Respiratory:  Positive for shortness of breath (her back pain is so sharp at times that is takes her breath away). Negative for cough and chest tightness.   ?Cardiovascular:  Negative for palpitations and leg swelling.  ?Musculoskeletal:  Positive for back pain, gait problem (gabapentin is causing some gait imbalance) and myalgias.  ?Neurological:  Positive for light-headedness. Negative for dizziness, weakness and  headaches.  ?Psychiatric/Behavioral:  Positive for dysphoric mood and sleep disturbance. The patient is nervous/anxious.   ? ?Patient Active Problem List  ? Diagnosis Date Noted  ? Aortic atherosclerosis ([facility]) 11/14/2021  ? Mixed hyperlipidemia 10/30/2021  ? Centrilobular emphysema ([facility]) 10/29/2021  ? Strain of thoracic back region 10/15/2021  ? Neck pain, chronic 07/31/2020  ? S/P total hysterectomy 07/31/2020  ? 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 Family history of breast cancer 09/07/2018  ? ? ?No Known Allergies ? ?Past Surgical  History:  ?Procedure Laterality Date  ? ABDOMINAL HYSTERECTOMY    ? c sections    ? CESAREAN SECTION    ? LUNG SURGERY    ? ? ?Social History  ? ?Tobacco Use  ? Smoking status: Former  ?  Packs/day: 1.00  ?  Years: 30.00  ?  Pack years: 30.00  ?  Types: Cigarettes  ?  Quit date: 09/18/1999  ?  Years since quitting: 22.1  ? Smokeless tobacco: Never  ?Vaping Use  ? Vaping Use: Never used  ?Substance Use Topics  ? Alcohol use: Yes  ?  Comment: social  ? Drug use: Never  ? ? ? ?Medication list has been reviewed and updated. ? ?Current Meds  ?Medication Sig  ? estradiol-norethindrone (ACTIVELLA) 1-0.5 MG tablet Take 1 tablet by mouth daily.  ? gabapentin (NEURONTIN) 300 MG capsule Take 300 mg by mouth 3 (three) times daily.  ? meloxicam (MOBIC) 15 MG tablet Take 15 mg by mouth daily.  ? [DISCONTINUED] ALPRAZolam (XANAX) 0.5 MG tablet Take 0.25 mg by mouth as needed.   ? [DISCONTINUED] cyclobenzaprine (FLEXERIL) 10 MG tablet Take 5 mg by mouth as needed.  ? ? ?PHQ 2/9 Scores 11/14/2021 10/29/2021 07/31/2020  ?PHQ - 2 Score 0 0 2  ?PHQ- 9 Score 3 3 2   ? ? ?GAD 7 : Generalized Anxiety Score 11/14/2021 10/29/2021  ?Nervous, Anxious, on Edge 1 1  ?Control/stop worrying 0 1  ?Worry too much - different things 0 1  ?Trouble relaxing 1 1  ?Restless 0 1  ?Easily annoyed or irritable 0 0  ?Afraid - awful might happen 0 0  ?Total GAD 7 Score 2 5  ? ? ?BP Readings from Last 3 Encounters:  ?11/14/21 (!) 146/82  ?10/29/21 118/78  ?02/15/21 102/66  ? ? ?Physical Exam ?Vitals and nursing note reviewed.  ?Constitutional:   ?   General: She is not in acute distress. ?   Appearance: She is well-developed.  ?HENT:  ?   Head: Normocephalic and atraumatic.  ?Pulmonary:  ?   Effort: Pulmonary effort is normal. No respiratory distress.  ?Skin: ?   General: Skin is warm and dry.  ?   Findings: No rash.  ?Neurological:  ?   Mental Status: She is alert and oriented to person, place, and time.  ?Psychiatric:     ?   Mood and Affect: Mood is anxious.     ?    Speech: Speech normal.     ?   Behavior: Behavior normal.  ? ? ?Wt Readings from Last 3 Encounters:  ?11/14/21 159 lb (72.1 kg)  ?10/29/21 154 lb (69.9 kg)  ?02/15/21 152 lb (68.9 kg)  ? ? ?BP (!) 146/82   Pulse (!) 109   Ht 5\' 7"  (1.702 m)   Wt 159 lb (72.1 kg)   SpO2 96%   BMI 24.90 kg/m?  ? ?Assessment and Plan: ?1. Thoracic disc herniation ?  She has some relief with mobic and gabapentin but unable to increase gaba due to side effects.  Can try 2 at HS rather than tid. ?She should seriously consider interlaminar injection so will follow up with [facility]. ?Recent CXR reviewed - no acute findings to explain pain/shortness of breath ?- cyclobenzaprine (FLEXERIL) 10 MG tablet; Take 0.5 tablets (5 mg total) by mouth in the morning and at bedtime.  Dispense: 60 tablet; Refill: 3 ? ?2. Encounter for screening mammogram for breast cancer ?- MM 3D SCREEN BREAST BILATERAL ? ?3. Generalized anxiety disorder ?Multiple recent stressors including her parents death and the death of a grandchild. ?Will refill Xanax ?- ALPRAZolam (XANAX) 0.5 MG tablet; Take 0.5 tablets (0.25 mg total) by mouth as needed.  Dispense: 30 tablet; Refill: 1 ? ? ?Partially dictated using Animal nutritionist. Any errors are unintentional. ? ?Bari Edward, MD ?[facility] ?[facility] ? ?11/14/2021 ? ? ? ? ? ?

## 2021-11-14 NOTE — Telephone Encounter (Signed)
Spoke with patient and she will keep appt to discuss chest XR and medication options. She said prednisone only gave mild relief. Today she feels very bad, and wants to keep appt. ?

## 2021-11-19 ENCOUNTER — Ambulatory Visit: Payer: 59 | Admitting: Internal Medicine

## 2021-11-20 ENCOUNTER — Telehealth: Payer: Self-pay

## 2021-11-20 NOTE — Telephone Encounter (Signed)
Called pt could not leave a VM. VM full. Calling as a reminder for pt to call and schedule mammogram. P:[phone] ? ?[facility] nurse may give results to patient if they return call to clinic, a CRM has been created. ? ?KP ?

## 2022-03-04 ENCOUNTER — Other Ambulatory Visit: Payer: Self-pay | Admitting: Obstetrics and Gynecology

## 2022-03-04 DIAGNOSIS — Z79890 Hormone replacement therapy (postmenopausal): Secondary | ICD-10-CM

## 2022-04-01 ENCOUNTER — Telehealth: Payer: Self-pay

## 2022-04-01 DIAGNOSIS — Z79890 Hormone replacement therapy (postmenopausal): Secondary | ICD-10-CM

## 2022-04-01 MED ORDER — ESTRADIOL-NORETHINDRONE ACET 1-0.5 MG PO TABS: 1.0000 | ORAL_TABLET | Freq: Every day | ORAL | 0 refills | Status: DC

## 2022-04-01 NOTE — Telephone Encounter (Signed)
Called Cassidy Suarez back and sent in one refill to hold her over until her appointment.

## 2022-04-01 NOTE — Telephone Encounter (Signed)
Cassidy Suarez called in stating she needs a refill on her estradiol until her appointment.

## 2022-04-30 DIAGNOSIS — L821 Other seborrheic keratosis: Secondary | ICD-10-CM | POA: Diagnosis not present

## 2022-04-30 DIAGNOSIS — L570 Actinic keratosis: Secondary | ICD-10-CM | POA: Diagnosis not present

## 2022-04-30 DIAGNOSIS — L578 Other skin changes due to chronic exposure to nonionizing radiation: Secondary | ICD-10-CM | POA: Diagnosis not present

## 2022-05-06 NOTE — Progress Notes (Deleted)
PCP: Reubin Milan, MD   No chief complaint on file.   HPI:      [name] is a 61 y.o. [phone] who LMP was No LMP recorded. Patient has had a hysterectomy., presents today for her annual examination.  Her menses are absent due to TAH for CPP yrs ago. She does not have postmenopausal bleeding.  She does have vasomotor sx. She uses activella with some sx improvement. No side effects. Wants to cont meds.   Sex activity: single partner, contraception - status post hysterectomy. She does have vaginal dryness, uses lubricants with relief.  Last Pap: 06/17/17  Results were: no abnormalities /neg HPV DNA. No longer indicated Hx of STDs: none  Last mammogram: 10/30/20  Results were: normal--routine follow-up in 12 months.  There is a FH of breast cancer in her mother (triple neg), mat aunt, and mat 2nd cousins, as well as PGM. Genetic testing not done, declined by pt several times. There is no FH of ovarian cancer. The patient does do self-breast exams.  Colonoscopy: colonoscopy 8 years ago without abnormalities. Pt followed by PCP for colonoscopy sched.   Having issues with painful area along coccyx  intermittently for several months. Sometimes gets really large and painful, hurts to sit. Has never drained. Then calms down. Sx getting more frequent.  Tobacco use: The patient denies current or previous tobacco use. Alcohol use: social drinker  No drug use. Exercise: moderately active  She does get adequate calcium and Vitamin D in her diet.  Still having issues with SUI, incontinence with walking and during sex. Saw Dr. Tiburcio Pea in past who suggested surgery   Labs with PCP   Past Medical History:  Diagnosis Date   Arthritis    Asthma    Head trauma 05/04/2019   Lung blebs (HCC)    Mixed incontinence    Pneumothorax    Visual disturbance     Past Surgical History:  Procedure Laterality Date   ABDOMINAL HYSTERECTOMY     c sections     CESAREAN SECTION     LUNG  SURGERY      Family History  Problem Relation Age of Onset   Diabetes Mother        DM Type 2   Stroke Mother    Breast cancer Mother 69       triple neg   Heart disease Mother    Hypertension Mother    Alzheimer's disease Mother    Heart disease Father    Hypertension Father    Diabetes Father        DM Type 2   Transient ischemic attack Father    Breast cancer Maternal Aunt 1   Lung cancer Cousin    Breast cancer Other    Breast cancer Paternal Grandmother     Social History   Socioeconomic History   Marital status: Married    Spouse name: Not on file   Number of children: 2   Years of education: college   Highest education level: Not on file  Occupational History   Occupation: community navigator  Tobacco Use   Smoking status: Former    Packs/day: 1.00    Years: 30.00    Total pack years: 30.00    Types: Cigarettes    Quit date: 09/18/1999    Years since quitting: 22.6   Smokeless tobacco: Never  Vaping Use   Vaping Use: Never used  Substance and Sexual Activity   Alcohol use: Yes  Comment: social   Drug use: Never   Sexual activity: Yes    Birth control/protection: Surgical    Comment: Hysterectomy  Other Topics Concern   Not on file  Social History Narrative   Lives at home with her husband.  Her parents live with her right now.  She is their caregiver.   Two cups coffee, one soda per day.   Right-handed.   Social Determinants of Health   Financial Resource Strain: Not on file  Food Insecurity: Not on file  Transportation Needs: Not on file  Physical Activity: Not on file  Stress: Not on file  Social Connections: Not on file  Intimate Partner Violence: Not on file    No outpatient medications have been marked as taking for the 05/07/22 encounter (Appointment) with Jaggar Benko, Ilona Sorrel, PA-C.     ROS:  Review of Systems  Constitutional:  Negative for fatigue, fever and unexpected weight change.  Respiratory:  Negative for cough, shortness  of breath and wheezing.   Cardiovascular:  Negative for chest pain, palpitations and leg swelling.  Gastrointestinal:  Negative for blood in stool, constipation, diarrhea, nausea and vomiting.  Endocrine: Negative for cold intolerance, heat intolerance and polyuria.  Genitourinary:  Negative for dyspareunia, dysuria, flank pain, frequency, genital sores, hematuria, menstrual problem, pelvic pain, urgency, vaginal bleeding, vaginal discharge and vaginal pain.  Musculoskeletal:  Negative for back pain, joint swelling and myalgias.  Skin:  Negative for color change and rash.  Neurological:  Negative for dizziness, syncope, light-headedness, numbness and headaches.  Hematological:  Negative for adenopathy.  Psychiatric/Behavioral:  Negative for agitation, confusion, sleep disturbance and suicidal ideas. The patient is not nervous/anxious.      Objective: There were no vitals taken for this visit.   Physical Exam Constitutional:      Appearance: She is well-developed.  Genitourinary:     Vulva normal.     Genitourinary Comments: UTERUS/CX SURG REM     Right Labia: No rash, tenderness or lesions.    Left Labia: No tenderness, lesions or rash.    No vaginal discharge, erythema or tenderness.      Right Adnexa: not tender and no mass present.    Left Adnexa: not tender and no mass present.    No cervical friability or polyp.     Uterus is not enlarged or tender.  Breasts:    Right: No mass, nipple discharge, skin change or tenderness.     Left: No mass, nipple discharge, skin change or tenderness.  Neck:     Thyroid: No thyromegaly.  Cardiovascular:     Rate and Rhythm: Normal rate and regular rhythm.     Heart sounds: Normal heart sounds. No murmur heard. Pulmonary:     Effort: Pulmonary effort is normal.     Breath sounds: Normal breath sounds.  Abdominal:     Palpations: Abdomen is soft.     Tenderness: There is no abdominal tenderness. There is no guarding or rebound.   Musculoskeletal:        General: Normal range of motion.     Cervical back: Normal range of motion.  Lymphadenopathy:     Cervical: No cervical adenopathy.  Neurological:     General: No focal deficit present.     Mental Status: She is alert and oriented to person, place, and time.     Cranial Nerves: No cranial nerve deficit.  Skin:    General: Skin is warm and dry.  Psychiatric:  Mood and Affect: Mood normal.        Behavior: Behavior normal.        Thought Content: Thought content normal.        Judgment: Judgment normal.  Vitals reviewed.     Assessment/Plan:  Encounter for annual routine gynecological examination  Encounter for screening mammogram for malignant neoplasm of breast - Plan: MM 3D SCREEN BREAST BILATERAL; pt current on mammo  Family history of breast cancer - Plan: MM 3D SCREEN BREAST BILATERAL; MyRisk testing discussed. Pt to f/u if desires.   Hormone replacement therapy (HRT) - Rx RF activella. F/u prn. - Plan: estradiol-norethindrone (ACTIVELLA) 1-0.5 MG tablet  Pilonidal cyst--discussed gen surg ref, declines for now. Pt will f/u if sx recur. Sitz baths, may need abx.    No orders of the defined types were placed in this encounter.           GYN counsel breast self exam, mammography screening, adequate intake of calcium and vitamin D, diet and exercise    F/U  No follow-ups on file.  Japji Kok B. Durelle Zepeda, PA-C 05/06/2022 8:20 PM

## 2022-05-07 ENCOUNTER — Telehealth: Payer: Self-pay

## 2022-05-07 ENCOUNTER — Ambulatory Visit: Payer: BC Managed Care – PPO | Admitting: Obstetrics and Gynecology

## 2022-05-07 DIAGNOSIS — Z79890 Hormone replacement therapy (postmenopausal): Secondary | ICD-10-CM

## 2022-05-07 DIAGNOSIS — Z1231 Encounter for screening mammogram for malignant neoplasm of breast: Secondary | ICD-10-CM

## 2022-05-07 DIAGNOSIS — Z01419 Encounter for gynecological examination (general) (routine) without abnormal findings: Secondary | ICD-10-CM

## 2022-05-07 DIAGNOSIS — Z1272 Encounter for screening for malignant neoplasm of vagina: Secondary | ICD-10-CM

## 2022-05-07 DIAGNOSIS — Z803 Family history of malignant neoplasm of breast: Secondary | ICD-10-CM

## 2022-05-07 DIAGNOSIS — Z1211 Encounter for screening for malignant neoplasm of colon: Secondary | ICD-10-CM

## 2022-05-07 MED ORDER — ESTRADIOL-NORETHINDRONE ACET 1-0.5 MG PO TABS: 1.0000 | ORAL_TABLET | Freq: Every day | ORAL | 0 refills | Status: DC

## 2022-05-07 NOTE — Telephone Encounter (Signed)
[facility] VOICEMAIL: Patient reports she is scheduled for annual 06/25/22. She needs a mammogram. This is the reason for the delay of this appointment. She will run out of Estradiol at the end of September. Requesting refill to get to appointment.

## 2022-05-07 NOTE — Telephone Encounter (Signed)
Spoke with patient. Advised I see orders for mammogram, but do not see her scheduled. Patient states she is calling today to get scheduled. Advised one refill of Estradiol sent to pharmacy.

## 2022-06-13 DIAGNOSIS — Z114 Encounter for screening for human immunodeficiency virus [HIV]: Secondary | ICD-10-CM | POA: Diagnosis not present

## 2022-06-13 DIAGNOSIS — Z1211 Encounter for screening for malignant neoplasm of colon: Secondary | ICD-10-CM | POA: Diagnosis not present

## 2022-06-13 DIAGNOSIS — R002 Palpitations: Secondary | ICD-10-CM | POA: Diagnosis not present

## 2022-06-13 DIAGNOSIS — Z13228 Encounter for screening for other metabolic disorders: Secondary | ICD-10-CM | POA: Diagnosis not present

## 2022-06-13 DIAGNOSIS — Z13220 Encounter for screening for lipoid disorders: Secondary | ICD-10-CM | POA: Diagnosis not present

## 2022-06-13 DIAGNOSIS — R319 Hematuria, unspecified: Secondary | ICD-10-CM | POA: Diagnosis not present

## 2022-06-13 DIAGNOSIS — Z1329 Encounter for screening for other suspected endocrine disorder: Secondary | ICD-10-CM | POA: Diagnosis not present

## 2022-06-13 DIAGNOSIS — Z1239 Encounter for other screening for malignant neoplasm of breast: Secondary | ICD-10-CM | POA: Diagnosis not present

## 2022-06-13 DIAGNOSIS — Z130 Encounter for screening for diseases of the blood and blood-forming organs and certain disorders involving the immune mechanism: Secondary | ICD-10-CM | POA: Diagnosis not present

## 2022-06-13 DIAGNOSIS — R109 Unspecified abdominal pain: Secondary | ICD-10-CM | POA: Diagnosis not present

## 2022-06-13 DIAGNOSIS — Z1159 Encounter for screening for other viral diseases: Secondary | ICD-10-CM | POA: Diagnosis not present

## 2022-06-16 DIAGNOSIS — Z9189 Other specified personal risk factors, not elsewhere classified: Secondary | ICD-10-CM

## 2022-06-16 DIAGNOSIS — Z1371 Encounter for nonprocreative screening for genetic disease carrier status: Secondary | ICD-10-CM

## 2022-06-16 HISTORY — DX: Other specified personal risk factors, not elsewhere classified: Z91.89

## 2022-06-16 HISTORY — DX: Encounter for nonprocreative screening for genetic disease carrier status: Z13.71

## 2022-06-17 ENCOUNTER — Other Ambulatory Visit: Payer: Self-pay | Admitting: Family Medicine

## 2022-06-17 ENCOUNTER — Other Ambulatory Visit: Payer: Self-pay | Admitting: Obstetrics and Gynecology

## 2022-06-17 DIAGNOSIS — Z1231 Encounter for screening mammogram for malignant neoplasm of breast: Secondary | ICD-10-CM

## 2022-06-24 NOTE — Progress Notes (Unsigned)
PCP: Patient, No Pcp Per   Chief Complaint  Patient presents with   Gynecologic Exam    Urinary Incontinence    HPI:      [name] is a 61 y.o. [id number] who LMP was No LMP recorded. Patient has had a hysterectomy., presents today for her annual examination.  Her menses are absent due to TAH for CPP yrs ago. She does not have postmenopausal bleeding.  She does have vasomotor sx/night sweats. She uses activella with some sx improvement with moods, but never had relief of night sweats. Doesn't want to stop HRT however.  No side effects.   Sex activity: single partner, contraception - status post hysterectomy. She does have vaginal dryness, uses lubricants with relief. Has always had pain with sex. Did TAH for sx without relief.   Last Pap: 06/17/17  Results were: no abnormalities /neg HPV DNA. No longer indicated Hx of STDs: none  Last mammogram: 10/30/20  Results were: normal--routine follow-up in 12 months. Has appt 10/23 There is a FH of breast cancer in her mother (triple neg), mat aunt, and mat 2nd cousins, as well as PGM and pat aunt. Genetic testing not done, declined by pt several times. Interested this yr.  There is no FH of ovarian cancer. The patient does do self-breast exams.  Colonoscopy: colonoscopy 9 years ago without abnormalities. Pt followed by PCP for colonoscopy sched.   Having issues with painful area along coccyx  intermittently for several yrs. Sx c/w pilonidal cyst last yr.   Tobacco use: The patient denies current or previous tobacco use. Alcohol use: none No drug use. Exercise: min active  She does get adequate calcium but not Vitamin D in her diet.  Still having issues with SUI, incontinence with walking, sitting and during sex without sensation to void. Saw Dr. Kenton Kingfisher in past who suggested surgery. Pt ready for surgery, but Dr. Kenton Kingfisher no longer here. Never did pelvic PT, was told to use ben wa balls but they're too painful for pt.   Labs with PCP   Doing therapy for loss of parents, still grieving. Wearing heart monitor now for heart palpitations.   Past Medical History:  Diagnosis Date   Arthritis    Asthma    Head trauma 05/04/2019   Lung blebs ([place])    Mixed incontinence    Pneumothorax    Visual disturbance     Past Surgical History:  Procedure Laterality Date   ABDOMINAL HYSTERECTOMY     c sections     CESAREAN SECTION     LUNG SURGERY      Family History  Problem Relation Age of Onset   Diabetes Mother        DM Type 2   Stroke Mother    Breast cancer Mother 85       triple neg   Heart disease Mother    Hypertension Mother    Alzheimer's disease Mother    Heart disease Father    Hypertension Father    Diabetes Father        DM Type 2   Transient ischemic attack Father    Breast cancer Paternal Grandmother        not sure of age   Breast cancer Maternal Aunt        55s   Lung cancer Cousin    Breast cancer Other    Breast cancer Paternal Aunt 15    Social History   Socioeconomic History   Marital  status: Married    Spouse name: Not on file   Number of children: 2   Years of education: college   Highest education level: Not on file  Occupational History   Occupation: community navigator  Tobacco Use   Smoking status: Former    Packs/day: 1.00    Years: 30.00    Total pack years: 30.00    Types: Cigarettes    Quit date: 09/18/1999    Years since quitting: 22.7   Smokeless tobacco: Never  Vaping Use   Vaping Use: Never used  Substance and Sexual Activity   Alcohol use: Yes    Comment: social   Drug use: Never   Sexual activity: Yes    Birth control/protection: Surgical    Comment: Hysterectomy  Other Topics Concern   Not on file  Social History Narrative   Lives at home with her husband.  Her parents live with her right now.  She is their caregiver.   Two cups coffee, one soda per day.   Right-handed.   Social Determinants of Health   Financial Resource Strain: Not on file   Food Insecurity: Not on file  Transportation Needs: Not on file  Physical Activity: Not on file  Stress: Not on file  Social Connections: Not on file  Intimate Partner Violence: Not on file    Current Meds  Medication Sig   [DISCONTINUED] estradiol-norethindrone (ACTIVELLA) 1-0.5 MG tablet Take 1 tablet by mouth daily.     ROS:  Review of Systems  Constitutional:  Negative for fatigue, fever and unexpected weight change.  Respiratory:  Negative for cough, shortness of breath and wheezing.   Cardiovascular:  Positive for palpitations. Negative for chest pain and leg swelling.  Gastrointestinal:  Negative for blood in stool, constipation, diarrhea, nausea and vomiting.  Endocrine: Negative for cold intolerance, heat intolerance and polyuria.  Genitourinary:  Positive for dyspareunia. Negative for dysuria, flank pain, frequency, genital sores, hematuria, menstrual problem, pelvic pain, urgency, vaginal bleeding, vaginal discharge and vaginal pain.  Musculoskeletal:  Negative for back pain, joint swelling and myalgias.  Skin:  Negative for color change and rash.  Neurological:  Positive for headaches. Negative for dizziness, syncope, light-headedness and numbness.  Hematological:  Negative for adenopathy.  Psychiatric/Behavioral:  Positive for agitation and dysphoric mood. Negative for confusion, sleep disturbance and suicidal ideas. The patient is not nervous/anxious.      Objective: BP 110/72   Ht 5' 7"  (1.702 m)   Wt 161 lb (73 kg)   BMI 25.22 kg/m    Physical Exam Constitutional:      Appearance: She is well-developed.  Genitourinary:     Vulva normal.     Genitourinary Comments: UTERUS/CX SURG REM     Right Labia: No rash, tenderness or lesions.    Left Labia: No tenderness, lesions or rash.    Vaginal cuff intact.    No vaginal discharge, erythema or tenderness.      Right Adnexa: not tender and no mass present.    Left Adnexa: not tender and no mass present.     Cervix is absent.     Uterus is absent.  Breasts:    Right: No mass, nipple discharge, skin change or tenderness.     Left: No mass, nipple discharge, skin change or tenderness.  Neck:     Thyroid: No thyromegaly.  Cardiovascular:     Rate and Rhythm: Normal rate and regular rhythm.     Heart sounds: Normal heart sounds. No murmur  heard. Pulmonary:     Effort: Pulmonary effort is normal.     Breath sounds: Normal breath sounds.  Abdominal:     Palpations: Abdomen is soft.     Tenderness: There is no abdominal tenderness. There is no guarding.  Musculoskeletal:        General: Normal range of motion.     Cervical back: Normal range of motion.  Neurological:     General: No focal deficit present.     Mental Status: She is alert and oriented to person, place, and time.     Cranial Nerves: No cranial nerve deficit.  Skin:    General: Skin is warm and dry.  Psychiatric:        Mood and Affect: Mood normal.        Behavior: Behavior normal.        Thought Content: Thought content normal.        Judgment: Judgment normal.  Vitals reviewed.     Assessment/Plan: Encounter for annual routine gynecological examination  Cervical cancer screening - Plan: Cytology - PAP  Encounter for screening mammogram for malignant neoplasm of breast; pt has appt  Family history of breast cancer - Plan: Integrated BRACAnalysis ([place]); MyRisk testing discussed and done today. Will call with results.   Hormone replacement therapy (HRT) - Plan: estradiol-norethindrone (ACTIVELLA) 1-0.5 MG tablet; Rx RF. Pt still with night sweats but don't want to increase HRT dose. Pt wants to continue HRT  Pilonidal cyst--can do gen surg ref prn. Not problematic now.   SUI (stress urinary incontinence, female) - Plan: Ambulatory referral to Urogynecology; [facility] at [facility] per pt request for mgmt/tx.   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    Dispense:  90 tablet    Refill:  3    Order Specific Question:   Supervising Provider    Answer:   Renaldo Reel            GYN counsel breast self exam, mammography screening, adequate intake of calcium and vitamin D, diet and exercise    F/U  Return in about 1 year (around 06/26/2023).  Cassidy Sachse B. Adamaris King, PA-C 06/25/2022 12:09 PM

## 2022-06-25 ENCOUNTER — Other Ambulatory Visit (HOSPITAL_COMMUNITY)
Admission: RE | Admit: 2022-06-25 | Discharge: 2022-06-25 | Disposition: A | Discharge status: Home / Self Care | Payer: BC Managed Care – PPO | Source: Ambulatory Visit | Attending: Obstetrics and Gynecology | Admitting: Obstetrics and Gynecology

## 2022-06-25 ENCOUNTER — Encounter: Payer: Self-pay | Admitting: Obstetrics and Gynecology

## 2022-06-25 ENCOUNTER — Ambulatory Visit (INDEPENDENT_AMBULATORY_CARE_PROVIDER_SITE_OTHER): Payer: BC Managed Care – PPO | Admitting: Obstetrics and Gynecology

## 2022-06-25 VITALS — BP 110/72 | Ht 67.0 in | Wt 161.0 lb

## 2022-06-25 DIAGNOSIS — Z171 Estrogen receptor negative status [ER-]: Secondary | ICD-10-CM | POA: Diagnosis not present

## 2022-06-25 DIAGNOSIS — Z79890 Hormone replacement therapy (postmenopausal): Secondary | ICD-10-CM

## 2022-06-25 DIAGNOSIS — Z1231 Encounter for screening mammogram for malignant neoplasm of breast: Secondary | ICD-10-CM

## 2022-06-25 DIAGNOSIS — Z124 Encounter for screening for malignant neoplasm of cervix: Secondary | ICD-10-CM | POA: Diagnosis not present

## 2022-06-25 DIAGNOSIS — Z01419 Encounter for gynecological examination (general) (routine) without abnormal findings: Secondary | ICD-10-CM | POA: Diagnosis not present

## 2022-06-25 DIAGNOSIS — N3942 Incontinence without sensory awareness: Secondary | ICD-10-CM

## 2022-06-25 DIAGNOSIS — N393 Stress incontinence (female) (male): Secondary | ICD-10-CM

## 2022-06-25 DIAGNOSIS — L0591 Pilonidal cyst without abscess: Secondary | ICD-10-CM

## 2022-06-25 DIAGNOSIS — Z803 Family history of malignant neoplasm of breast: Secondary | ICD-10-CM | POA: Diagnosis not present

## 2022-06-25 DIAGNOSIS — Z801 Family history of malignant neoplasm of trachea, bronchus and lung: Secondary | ICD-10-CM | POA: Diagnosis not present

## 2022-06-25 MED ORDER — ESTRADIOL-NORETHINDRONE ACET 1-0.5 MG PO TABS: 1.0000 | ORAL_TABLET | Freq: Every day | ORAL | 3 refills | Status: DC

## 2022-06-25 NOTE — Patient Instructions (Signed)
I value your feedback and you entrusting us with your care. If you get a  patient survey, I would appreciate you taking the time to let us know about your experience today. Thank you! ? ? ?

## 2022-06-27 LAB — CYTOLOGY - PAP: Diagnosis: NEGATIVE

## 2022-07-02 DIAGNOSIS — D225 Melanocytic nevi of trunk: Secondary | ICD-10-CM | POA: Diagnosis not present

## 2022-07-02 DIAGNOSIS — L570 Actinic keratosis: Secondary | ICD-10-CM | POA: Diagnosis not present

## 2022-07-02 DIAGNOSIS — L821 Other seborrheic keratosis: Secondary | ICD-10-CM | POA: Diagnosis not present

## 2022-07-02 DIAGNOSIS — L578 Other skin changes due to chronic exposure to nonionizing radiation: Secondary | ICD-10-CM | POA: Diagnosis not present

## 2022-07-04 ENCOUNTER — Encounter: Payer: Self-pay | Admitting: Obstetrics and Gynecology

## 2022-07-04 ENCOUNTER — Ambulatory Visit
Admission: RE | Admit: 2022-07-04 | Discharge: 2022-07-04 | Disposition: A | Discharge status: Home / Self Care | Payer: BC Managed Care – PPO | Source: Ambulatory Visit | Attending: Obstetrics and Gynecology | Admitting: Obstetrics and Gynecology

## 2022-07-04 DIAGNOSIS — Z1231 Encounter for screening mammogram for malignant neoplasm of breast: Secondary | ICD-10-CM | POA: Insufficient documentation

## 2022-07-04 HISTORY — DX: Family history of malignant neoplasm of breast: Z80.3

## 2022-07-08 DIAGNOSIS — R002 Palpitations: Secondary | ICD-10-CM | POA: Diagnosis not present

## 2022-07-09 ENCOUNTER — Encounter: Payer: Self-pay | Admitting: Obstetrics and Gynecology

## 2022-07-09 DIAGNOSIS — R002 Palpitations: Secondary | ICD-10-CM | POA: Diagnosis not present

## 2022-07-09 NOTE — Telephone Encounter (Signed)
Do I need to change order in system? See pt's message.

## 2022-07-10 DIAGNOSIS — R319 Hematuria, unspecified: Secondary | ICD-10-CM | POA: Diagnosis not present

## 2022-07-10 DIAGNOSIS — R109 Unspecified abdominal pain: Secondary | ICD-10-CM | POA: Diagnosis not present

## 2022-07-11 ENCOUNTER — Telehealth: Payer: Self-pay | Admitting: Obstetrics and Gynecology

## 2022-07-11 NOTE — Telephone Encounter (Signed)
Pt aware of neg MyRisk results. IBIS=24.8%/riskscore=25.4%. Pt aware of recommendations on monthly SBE, yearly CBE and mammos, as well as scr breast MRI. Current on mammo, wants to hold off on MRI.   Patient understands these results only apply to her and her children, and this is not indicative of genetic testing results of her other family members. It is recommended that her other family members have genetic testing done.  Pt also understands negative genetic testing doesn't mean she will never get any of these cancers.   Hard copy mailed to pt. F/u prn.

## 2022-07-18 DIAGNOSIS — I700 Atherosclerosis of aorta: Secondary | ICD-10-CM | POA: Diagnosis not present

## 2022-07-18 DIAGNOSIS — E782 Mixed hyperlipidemia: Secondary | ICD-10-CM | POA: Diagnosis not present

## 2022-07-18 DIAGNOSIS — G43009 Migraine without aura, not intractable, without status migrainosus: Secondary | ICD-10-CM | POA: Diagnosis not present

## 2022-07-18 DIAGNOSIS — R319 Hematuria, unspecified: Secondary | ICD-10-CM | POA: Diagnosis not present

## 2022-07-24 NOTE — Telephone Encounter (Signed)
I contacted patient via phone. I advised I would contact [facility] urogyneology. No one answered I had to leave detail voicemail for them to call me back

## 2022-07-24 NOTE — Telephone Encounter (Signed)
I contacted referring office first available in Feb 27 at 1:00 pm with Arrival at 12:40 pm I left message for patient to give [place] a call if she had any questions or concerns

## 2022-08-14 DIAGNOSIS — U071 COVID-19: Secondary | ICD-10-CM | POA: Diagnosis not present

## 2022-09-18 DIAGNOSIS — J432 Centrilobular emphysema: Secondary | ICD-10-CM | POA: Diagnosis not present

## 2022-09-18 DIAGNOSIS — G43009 Migraine without aura, not intractable, without status migrainosus: Secondary | ICD-10-CM | POA: Diagnosis not present

## 2022-09-18 DIAGNOSIS — E782 Mixed hyperlipidemia: Secondary | ICD-10-CM | POA: Diagnosis not present

## 2022-09-18 DIAGNOSIS — Z1211 Encounter for screening for malignant neoplasm of colon: Secondary | ICD-10-CM | POA: Diagnosis not present

## 2022-09-19 DIAGNOSIS — J432 Centrilobular emphysema: Secondary | ICD-10-CM | POA: Diagnosis not present

## 2022-09-19 DIAGNOSIS — R0600 Dyspnea, unspecified: Secondary | ICD-10-CM | POA: Diagnosis not present

## 2022-11-12 ENCOUNTER — Encounter: Payer: Self-pay | Admitting: Obstetrics and Gynecology

## 2022-11-12 ENCOUNTER — Other Ambulatory Visit (HOSPITAL_COMMUNITY)
Admission: RE | Admit: 2022-11-12 | Discharge: 2022-11-12 | Disposition: A | Discharge status: Home / Self Care | Payer: BC Managed Care – PPO | Attending: Obstetrics and Gynecology | Admitting: Obstetrics and Gynecology

## 2022-11-12 ENCOUNTER — Ambulatory Visit (INDEPENDENT_AMBULATORY_CARE_PROVIDER_SITE_OTHER): Payer: BC Managed Care – PPO | Admitting: Obstetrics and Gynecology

## 2022-11-12 VITALS — BP 115/79 | HR 71 | Ht 66.65 in | Wt 160.0 lb

## 2022-11-12 DIAGNOSIS — N3941 Urge incontinence: Secondary | ICD-10-CM | POA: Diagnosis not present

## 2022-11-12 DIAGNOSIS — N3000 Acute cystitis without hematuria: Secondary | ICD-10-CM

## 2022-11-12 DIAGNOSIS — R350 Frequency of micturition: Secondary | ICD-10-CM

## 2022-11-12 DIAGNOSIS — N393 Stress incontinence (female) (male): Secondary | ICD-10-CM

## 2022-11-12 DIAGNOSIS — R3121 Asymptomatic microscopic hematuria: Secondary | ICD-10-CM

## 2022-11-12 DIAGNOSIS — N8110 Cystocele, unspecified: Secondary | ICD-10-CM

## 2022-11-12 LAB — POCT URINALYSIS DIPSTICK
Bilirubin, UA: NEGATIVE
Blood, UA: POSITIVE
Glucose, UA: NEGATIVE
Ketones, UA: NEGATIVE
Leukocytes, UA: NEGATIVE
Nitrite, UA: NEGATIVE
Protein, UA: NEGATIVE
Spec Grav, UA: 1.01 (ref 1.010–1.025)
Urobilinogen, UA: 0.2 E.U./dL
pH, UA: 7 (ref 5.0–8.0)

## 2022-11-12 LAB — URINALYSIS, ROUTINE W REFLEX MICROSCOPIC
Bilirubin Urine: NEGATIVE
Glucose, UA: NEGATIVE mg/dL
Ketones, ur: NEGATIVE mg/dL
Leukocytes,Ua: NEGATIVE
Nitrite: NEGATIVE
Protein, ur: NEGATIVE mg/dL
Specific Gravity, Urine: 1.01 (ref 1.005–1.030)
pH: 7 (ref 5.0–8.0)

## 2022-11-12 LAB — URINALYSIS, MICROSCOPIC (REFLEX)

## 2022-11-12 NOTE — Progress Notes (Signed)
Urogynecology New Patient Evaluation and Consultation  Referring Provider: Amalia Greenhouse PCP: Patient, No Pcp Per Date of Service: 11/12/2022  SUBJECTIVE Chief Complaint: New Patient (Initial Visit) Cassidy Suarez is a 62 y.o. female here for a consult for SUI. Pt said she feels sex is nasty.)  History of Present Illness: Cassidy Suarez is a 62 y.o. White or Caucasian female seen in consultation at the request of PA Elmo Putt Copland for evaluation of incontinence.    Review of records significant for:  Has incontinence with walking and during sex.   Urinary Symptoms: Leaks urine with cough/ sneeze, laughing, exercise, lifting, going from sitting to standing, during sex, with urgency, and without sensation. SUI >> UUI Leakage interferes with her sex life.  Leaks "all day" Pad use: 2 pads per day.   She is bothered by her UI symptoms.  Day time voids 4-6.  Nocturia: 2 times per night to void. Voiding dysfunction: she does not empty her bladder well.  does not use a catheter to empty bladder.  When urinating, she feels to push on her belly or vagina to empty bladder Drinks: 2 cups coffee in AM (has cut back from a pot of coffee), 1.5 bottles water, glass of milk per day, soda in evening (previously drinking 2-3 per day)  UTIs:  0  UTI's in the last year.   Reports history of blood in urine (07/10/22- had CT A/P which was negative for any GU pathology  Pelvic Organ Prolapse Symptoms:                  She Denies a feeling of a bulge the vaginal area.   Bowel Symptom: Bowel movements: 2 time(s) per day Stool consistency: soft  Straining: no.  Splinting: no.  Incomplete evacuation: no.  She Denies accidental bowel leakage / fecal incontinence Bowel regimen: none   Sexual Function Sexually active: yes.  Pain with sex: No  Pelvic Pain Admits to pelvic pain Location: SI joins and hips Pain occurs: throughout the day Prior pain treatment: none Improved  by: relaxing Worsened by: continued movement   Past Medical History:  Past Medical History:  Diagnosis Date   Arthritis    Asthma    BRCA negative 06/2022   MyRisk neg   Family history of breast cancer    Head trauma 05/04/2019   Increased risk of breast cancer 06/2022   IBIS=24.8%/riskscore=25.4%   Lung blebs ([facility])    Mixed incontinence    Pneumothorax    Visual disturbance      Past Surgical History:   Past Surgical History:  Procedure Laterality Date   ABDOMINAL HYSTERECTOMY     c sections     CESAREAN SECTION     LUNG SURGERY     chest tube     Past OB/GYN History: OB History  Gravida Para Term Preterm AB Living  '2 2 2     2  '$ SAB IAB Ectopic Multiple Live Births          2    # Outcome Date GA Lbr Len/2nd Weight Sex Delivery Anes PTL Lv  2 Term      CS-LTranv     1 Term      CS-LTranv       S/p hysterectomy   Medications: She has a current medication list which includes the following prescription(s): estradiol-norethindrone, rosuvastatin, sumatriptan, and umeclidinium bromide.   Allergies: Patient has No Known Allergies.   Social History:  Social History   Tobacco Use   Smoking status: Former    Packs/day: 1.00    Years: 30.00    Total pack years: 30.00    Types: Cigarettes    Quit date: 09/18/1999    Years since quitting: 23.1   Smokeless tobacco: Never  Vaping Use   Vaping Use: Never used  Substance Use Topics   Alcohol use: Yes    Comment: social   Drug use: Never    Relationship status: married She lives with her husband.   She is employed as a Programmer, multimedia. Regular exercise: No History of abuse: No  Family History:   Family History  Problem Relation Age of Onset   Diabetes Mother        DM Type 2   Stroke Mother    Breast cancer Mother 70       triple neg   Heart disease Mother    Hypertension Mother    Alzheimer's disease Mother    Heart disease Father    Hypertension Father    Diabetes Father        DM Type  2   Transient ischemic attack Father    Breast cancer Paternal Grandmother        not sure of age   Breast cancer Maternal Aunt        78s   Lung cancer Cousin    Breast cancer Other    Breast cancer Paternal Aunt 53     Review of Systems: Review of Systems  Constitutional:  Negative for fever, malaise/fatigue and weight loss.  Respiratory:  Negative for cough, shortness of breath and wheezing.   Cardiovascular:  Negative for chest pain, palpitations and leg swelling.  Gastrointestinal:  Negative for abdominal pain and blood in stool.  Genitourinary:  Negative for dysuria.  Musculoskeletal:  Negative for myalgias.  Skin:  Negative for rash.  Neurological:  Negative for dizziness and headaches.  Endo/Heme/Allergies:  Bruises/bleeds easily.       + hot flashes  Psychiatric/Behavioral:  Negative for depression. The patient is not nervous/anxious.      OBJECTIVE Physical Exam: Vitals:   11/12/22 1302 11/12/22 1311  BP: (!) 148/93 115/79  Pulse: 73 71  Weight: 160 lb (72.6 kg)   Height: 5' 6.65" (1.693 m)     Physical Exam Constitutional:      General: She is not in acute distress. Pulmonary:     Effort: Pulmonary effort is normal.  Abdominal:     General: There is no distension.     Palpations: Abdomen is soft.     Tenderness: There is no abdominal tenderness. There is no rebound.  Musculoskeletal:        General: No swelling. Normal range of motion.  Skin:    General: Skin is warm and dry.     Findings: No rash.  Neurological:     Mental Status: She is alert and oriented to person, place, and time.  Psychiatric:        Mood and Affect: Mood normal.        Behavior: Behavior normal.      GU / Detailed Urogynecologic Evaluation:  Pelvic Exam: Normal external female genitalia; Bartholin's and Skene's glands normal in appearance; urethral meatus normal in appearance, no urethral masses or discharge.   CST: positive  s/p hysterectomy: Speculum exam reveals  normal vaginal mucosa with  atrophy and normal vaginal cuff.  Adnexa normal adnexa.    Pelvic floor strength I/V  Pelvic floor musculature: Right levator non-tender, Right obturator non-tender, Left levator non-tender, Left obturator non-tender  POP-Q:   POP-Q  -1.5                                            Aa   -1.5                                           Ba  -7                                              C   3                                            Gh  4                                            Pb  7                                            tvl   -3                                            Ap  -3                                            Bp                                                 D      Rectal Exam:  Normal external rectum  Post-Void Residual (PVR) by Bladder Scan: In order to evaluate bladder emptying, we discussed obtaining a postvoid residual and she agreed to this procedure.  Procedure: The ultrasound unit was placed on the patient's abdomen in the suprapubic region after the patient had voided. A PVR of 10 ml was obtained by bladder scan.  Laboratory Results: POC urine: small blood in urine    ASSESSMENT AND PLAN Ms. Lockard is a 62 y.o. with:  1. SUI (stress urinary incontinence, female)   2. Urinary frequency   3. Urge incontinence   4. Asymptomatic microscopic hematuria   5. Prolapse of anterior vaginal wall    SUI - For treatment of stress urinary incontinence,  non-surgical options include expectant management, weight loss, physical therapy, as well as a pessary.  Surgical options include a midurethral sling, Burch urethropexy, and transurethral injection of a  bulking agent. - She is interested in a sling. We reviewed the patient's specific anatomic and functional findings, with the assistance of diagrams and handouts.  We reviewed the treatment options including expectant management, conservative management, medical  management, and surgical management.  We reviewed the benefits and risks of each treatment option. We discussed risks of bleeding, infection, damage to surrounding organs including bowel, bladder, blood vessels, ureters and nerves, need for further surgery, numbness and weakness at any body site. Also reviewed risk of catheter and urinary retention as well as mesh erosion an pain. She would like to proceed with the sling.   2. Urgency/ urge incontinence - Discussed option of starting medication but she does not feel this is bothersome enough for treatment at this time.  - Reviewed reducing bladder irritants like coffee and soda.   3. Microscopic hematuria - Normal CT last year. Will plan for cystoscopy for hematuria at time of sling.   4. Anterior vaginal prolapse - mild anterior vaginal prolapse, asymptomatic. Will expectantly manage.   Request sent for surgery scheduling. Will plan for pre op visit.   Jaquita Folds, MD

## 2022-11-12 NOTE — Patient Instructions (Addendum)
Today we talked about ways to manage bladder urgency such as altering your diet to avoid irritative beverages and foods (bladder diet) as well as attempting to decrease stress and other exacerbating factors.    The Most Bothersome Foods* The Least Bothersome Foods*  Coffee - Regular & Decaf Tea - caffeinated Carbonated beverages - cola, non-colas, diet & caffeine-free Alcohols - Beer, Red Wine, White Wine, Champagne Fruits - Grapefruit, Ethete, Orange, [employer] - Cranberry, Grapefruit, Orange, Pineapple Vegetables - Tomato & Tomato Products Flavor Enhancers - Hot peppers, Spicy foods, Chili, Horseradish, Vinegar, Monosodium glutamate (MSG) Artificial Sweeteners - NutraSweet, Sweet 'N Low, Equal (sweetener), Saccharin Ethnic foods - [place], [place], [place] food Express Scripts - low-fat & whole Fruits - Bananas, Blueberries, Honeydew melon, Pears, Raisins, Watermelon Vegetables - Broccoli, Brussels Sprouts, [place], Carrots, Cauliflower, [place], Cucumber, Mushrooms, Peas, Radishes, Squash, Zucchini, White potatoes, Sweet potatoes & yams Poultry - Chicken, Eggs, [place], [employer] - Beef, Programmer, multimedia, Lamb Seafood - Shrimp, Devol fish, Salmon Grains - Oat, Rice Snacks - Pretzels, Popcorn  *Lissa Morales et al. Diet and its role in interstitial cystitis/bladder pain syndrome (IC/BPS) and comorbid conditions. [place] 2012 Jan 11.    For treatment of stress urinary incontinence, which is leakage with physical activity/movement/strainging/coughing, we discussed expectant management versus nonsurgical options versus surgery. Nonsurgical options include weight loss, physical therapy, as well as a pessary.  Surgical options include a midurethral sling, which is a synthetic mesh sling that acts like a hammock under the urethra to prevent leakage of urine, and transurethral injection of a bulking agent.

## 2022-11-14 LAB — URINE CULTURE: Culture: >=100000 — AB

## 2022-11-14 MED ORDER — SULFAMETHOXAZOLE-TRIMETHOPRIM 800-160 MG PO TABS: 1.0000 | ORAL_TABLET | Freq: Two times a day (BID) | ORAL | 0 refills | Status: AC

## 2022-11-14 NOTE — Addendum Note (Signed)
Addended by: Jaquita Folds on: 11/14/2022 11:04 AM   Modules accepted: Orders

## 2022-11-14 NOTE — Progress Notes (Addendum)
Attempted to contact patient. [place]  2nd attempt. Conversation viewed in Pharmacist, community.

## 2022-12-03 DIAGNOSIS — M25521 Pain in right elbow: Secondary | ICD-10-CM | POA: Diagnosis not present

## 2022-12-03 DIAGNOSIS — M79642 Pain in left hand: Secondary | ICD-10-CM | POA: Diagnosis not present

## 2022-12-03 DIAGNOSIS — M79641 Pain in right hand: Secondary | ICD-10-CM | POA: Diagnosis not present

## 2022-12-03 DIAGNOSIS — M25522 Pain in left elbow: Secondary | ICD-10-CM | POA: Diagnosis not present

## 2022-12-18 DIAGNOSIS — M25521 Pain in right elbow: Secondary | ICD-10-CM | POA: Diagnosis not present

## 2022-12-18 DIAGNOSIS — M1990 Unspecified osteoarthritis, unspecified site: Secondary | ICD-10-CM | POA: Diagnosis not present

## 2022-12-18 DIAGNOSIS — M25551 Pain in right hip: Secondary | ICD-10-CM | POA: Diagnosis not present

## 2022-12-18 DIAGNOSIS — R519 Headache, unspecified: Secondary | ICD-10-CM | POA: Diagnosis not present

## 2022-12-19 ENCOUNTER — Telehealth (HOSPITAL_BASED_OUTPATIENT_CLINIC_OR_DEPARTMENT_OTHER): Payer: Self-pay | Admitting: *Deleted

## 2022-12-19 NOTE — Telephone Encounter (Signed)
Pt called for Kim to schedule surgery with Dr Wannetta Sender.  I left message for patient that Maudie Mercury is out of the office and will call when she returns. KW CMA

## 2022-12-30 DIAGNOSIS — M79645 Pain in left finger(s): Secondary | ICD-10-CM | POA: Diagnosis not present

## 2023-01-02 NOTE — Telephone Encounter (Signed)
Called pt to let her know that provider would not be doing surgeries May- sometime in August. Advised that I would call her back when I had more definitive dates for scheduling.

## 2023-04-18 ENCOUNTER — Ambulatory Visit (INDEPENDENT_AMBULATORY_CARE_PROVIDER_SITE_OTHER): Payer: BC Managed Care – PPO | Admitting: Obstetrics and Gynecology

## 2023-04-18 ENCOUNTER — Encounter: Payer: Self-pay | Admitting: Obstetrics and Gynecology

## 2023-04-18 VITALS — BP 108/66 | HR 73 | Wt 164.0 lb

## 2023-04-18 DIAGNOSIS — Z01818 Encounter for other preprocedural examination: Secondary | ICD-10-CM

## 2023-04-18 MED ORDER — ACETAMINOPHEN 500 MG PO TABS: 500.0000 mg | ORAL_TABLET | Freq: Four times a day (QID) | ORAL | 0 refills | Status: DC | PRN

## 2023-04-18 MED ORDER — IBUPROFEN 600 MG PO TABS: 600.0000 mg | ORAL_TABLET | Freq: Four times a day (QID) | ORAL | 0 refills | Status: DC | PRN

## 2023-04-18 MED ORDER — OXYCODONE HCL 5 MG PO TABS: 5.0000 mg | ORAL_TABLET | ORAL | 0 refills | Status: DC | PRN

## 2023-04-18 MED ORDER — POLYETHYLENE GLYCOL 3350 17 GM/SCOOP PO POWD: 17.0000 g | Freq: Every day | ORAL | 0 refills | Status: DC

## 2023-04-18 NOTE — H&P (Signed)
[facility] H&P  Subjective Chief Complaint: Cassidy Suarez presents for a preoperative encounter.   History of Present Illness: Cassidy Suarez is a 62 y.o. female who presents for preoperative visit.  She is scheduled to undergo Exam under anesthesia, Mid-urethral Sling, and Cystoscopy on 05/05/23.  Her symptoms include Stress urinary incontinence, and she was was found to have Stage I anterior, Stage 0 posterior, Stage 0 apical prolapse.   Urodynamics showed: Deferred, + CST on exam  Past Medical History:  Diagnosis Date   Arthritis    Asthma    BRCA negative 06/2022   MyRisk neg   Family history of breast cancer    Head trauma 05/04/2019   Increased risk of breast cancer 06/2022   IBIS=24.8%/riskscore=25.4%   Lung blebs ([facility])    Mixed incontinence    Pneumothorax    Visual disturbance      Past Surgical History:  Procedure Laterality Date   ABDOMINAL HYSTERECTOMY     c sections     CESAREAN SECTION     LUNG SURGERY     chest tube    has No Known Allergies.   Family History  Problem Relation Age of Onset   Diabetes Mother        DM Type 2   Stroke Mother    Breast cancer Mother 29       triple neg   Heart disease Mother    Hypertension Mother    Alzheimer's disease Mother    Heart disease Father    Hypertension Father    Diabetes Father        DM Type 2   Transient ischemic attack Father    Breast cancer Paternal Grandmother        not sure of age   Breast cancer Maternal Aunt        59s   Lung cancer Cousin    Breast cancer Other    Breast cancer Paternal Aunt 32    Social History   Tobacco Use   Smoking status: Former    Current packs/day: 0.00    Average packs/day: 1 pack/day for 30.0 years (30.0 ttl pk-yrs)    Types: Cigarettes    Start date: 09/17/1969    Quit date: 09/18/1999    Years since quitting: 23.5   Smokeless tobacco: Never  Vaping Use   Vaping status: Never Used  Substance Use Topics   Alcohol use: Yes    Comment:  social   Drug use: Never     Review of Systems was negative for a full 10 system review except as noted in the History of Present Illness.  No current facility-administered medications for this encounter.  Current Outpatient Medications:    acetaminophen (TYLENOL) 500 MG tablet, Take 1 tablet (500 mg total) by mouth every 6 (six) hours as needed (pain)., Disp: 30 tablet, Rfl: 0   estradiol-norethindrone (ACTIVELLA) 1-0.5 MG tablet, Take 1 tablet by mouth daily., Disp: 90 tablet, Rfl: 3   ibuprofen (ADVIL) 600 MG tablet, Take 1 tablet (600 mg total) by mouth every 6 (six) hours as needed., Disp: 30 tablet, Rfl: 0   oxyCODONE (OXY IR/ROXICODONE) 5 MG immediate release tablet, Take 1 tablet (5 mg total) by mouth every 4 (four) hours as needed for severe pain., Disp: 5 tablet, Rfl: 0   polyethylene glycol powder (GLYCOLAX/MIRALAX) 17 GM/SCOOP powder, Take 17 g by mouth daily. Drink 17g (1 scoop) dissolved in water per day., Disp: 255 g, Rfl: 0  rosuvastatin (CRESTOR) 5 MG tablet, Take by mouth., Disp: , Rfl:    SUMAtriptan (IMITREX) 50 MG tablet, 50 to 100 mg as a single dose. If symptoms persist or return, may repeat dose after 2 hours. Maximum dose: 100 mg/dose; 200 mg per 24 hours., Disp: , Rfl:    umeclidinium bromide (INCRUSE ELLIPTA) 62.5 MCG/ACT AEPB, Inhale into the lungs., Disp: , Rfl:    Objective There were no vitals filed for this visit.   Gen: NAD CV: S1 S2 RRR Lungs: Clear to auscultation bilaterally Abd: soft, nontender   Previous Pelvic Exam showed: Pelvic Exam: Normal external female genitalia; Bartholin's and Skene's glands normal in appearance; urethral meatus normal in appearance, no urethral masses or discharge.    CST: positive   s/p hysterectomy: Speculum exam reveals normal vaginal mucosa with  atrophy and normal vaginal cuff.  Adnexa normal adnexa.     Pelvic floor strength I/V   Pelvic floor musculature: Right levator non-tender, Right obturator  non-tender, Left levator non-tender, Left obturator non-tender   POP-Q:    POP-Q   -1.5                                            Aa   -1.5                                           Ba   -7                                              C    3                                            Gh   4                                            Pb   7                                            tvl    -3                                            Ap   -3                                            Bp  D          Rectal Exam:  Normal external rectum    Assessment/ Plan  Assessment: The patient is a 62 y.o. year old scheduled to undergo Exam under anesthesia, Mid-urethral Sling, and Cystoscopy. Verbal consent was obtained for these procedures.

## 2023-04-18 NOTE — Progress Notes (Signed)
[facility] Pre-Operative Exam  Subjective Chief Complaint: Cassidy Suarez presents for a preoperative encounter.   History of Present Illness: Cassidy Suarez is a 62 y.o. female who presents for preoperative visit.  She is scheduled to undergo Exam under anesthesia, Mid-urethral Sling, and Cystoscopy on 05/05/23.  Her symptoms include Stress urinary incontinence, and she was was found to have Stage I anterior, Stage 0 posterior, Stage 0 apical prolapse.   Urodynamics showed: Deferred, + CST on exam  Past Medical History:  Diagnosis Date   Arthritis    Asthma    BRCA negative 06/2022   MyRisk neg   Family history of breast cancer    Head trauma 05/04/2019   Increased risk of breast cancer 06/2022   IBIS=24.8%/riskscore=25.4%   Lung blebs (HCC)    Mixed incontinence    Pneumothorax    Visual disturbance      Past Surgical History:  Procedure Laterality Date   ABDOMINAL HYSTERECTOMY     c sections     CESAREAN SECTION     LUNG SURGERY     chest tube    has No Known Allergies.   Family History  Problem Relation Age of Onset   Diabetes Mother        DM Type 2   Stroke Mother    Breast cancer Mother 13       triple neg   Heart disease Mother    Hypertension Mother    Alzheimer's disease Mother    Heart disease Father    Hypertension Father    Diabetes Father        DM Type 2   Transient ischemic attack Father    Breast cancer Paternal Grandmother        not sure of age   Breast cancer Maternal Aunt        40s   Lung cancer Cousin    Breast cancer Other    Breast cancer Paternal Aunt 87    Social History   Tobacco Use   Smoking status: Former    Current packs/day: 0.00    Average packs/day: 1 pack/day for 30.0 years (30.0 ttl pk-yrs)    Types: Cigarettes    Start date: 09/17/1969    Quit date: 09/18/1999    Years since quitting: 23.5   Smokeless tobacco: Never  Vaping Use   Vaping status: Never Used  Substance Use Topics   Alcohol use:  Yes    Comment: social   Drug use: Never     Review of Systems was negative for a full 10 system review except as noted in the History of Present Illness.   Current Outpatient Medications:    acetaminophen (TYLENOL) 500 MG tablet, Take 1 tablet (500 mg total) by mouth every 6 (six) hours as needed (pain)., Disp: 30 tablet, Rfl: 0   estradiol-norethindrone (ACTIVELLA) 1-0.5 MG tablet, Take 1 tablet by mouth daily., Disp: 90 tablet, Rfl: 3   ibuprofen (ADVIL) 600 MG tablet, Take 1 tablet (600 mg total) by mouth every 6 (six) hours as needed., Disp: 30 tablet, Rfl: 0   oxyCODONE (OXY IR/ROXICODONE) 5 MG immediate release tablet, Take 1 tablet (5 mg total) by mouth every 4 (four) hours as needed for severe pain., Disp: 5 tablet, Rfl: 0   polyethylene glycol powder (GLYCOLAX/MIRALAX) 17 GM/SCOOP powder, Take 17 g by mouth daily. Drink 17g (1 scoop) dissolved in water per day., Disp: 255 g, Rfl: 0   SUMAtriptan (IMITREX) 50 MG tablet,  50 to 100 mg as a single dose. If symptoms persist or return, may repeat dose after 2 hours. Maximum dose: 100 mg/dose; 200 mg per 24 hours., Disp: , Rfl:    umeclidinium bromide (INCRUSE ELLIPTA) 62.5 MCG/ACT AEPB, Inhale into the lungs., Disp: , Rfl:    rosuvastatin (CRESTOR) 5 MG tablet, Take by mouth., Disp: , Rfl:    Objective Vitals:   04/18/23 1019  BP: 108/66  Pulse: 73    Gen: NAD CV: S1 S2 RRR Lungs: Clear to auscultation bilaterally Abd: soft, nontender   Previous Pelvic Exam showed: Pelvic Exam: Normal external female genitalia; Bartholin's and Skene's glands normal in appearance; urethral meatus normal in appearance, no urethral masses or discharge.    CST: positive   s/p hysterectomy: Speculum exam reveals normal vaginal mucosa with  atrophy and normal vaginal cuff.  Adnexa normal adnexa.     Pelvic floor strength I/V   Pelvic floor musculature: Right levator non-tender, Right obturator non-tender, Left levator non-tender, Left obturator  non-tender   POP-Q:    POP-Q   -1.5                                            Aa   -1.5                                           Ba   -7                                              C    3                                            Gh   4                                            Pb   7                                            tvl    -3                                            Ap   -3                                            Bp  D          Rectal Exam:  Normal external rectum    Assessment/ Plan  Assessment: The patient is a 62 y.o. year old scheduled to undergo Exam under anesthesia, Mid-urethral Sling, and Cystoscopy. Verbal consent was obtained for these procedures.  Plan: General Surgical Consent: The patient has previously been counseled on alternative treatments, and the decision by the patient and provider was to proceed with the procedure listed above.  For all procedures, there are risks of bleeding, infection, damage to surrounding organs including but not limited to bowel, bladder, blood vessels, ureters and nerves, and need for further surgery if an injury were to occur. These risks are all low with minimally invasive surgery.   There are risks of numbness and weakness at any body site or buttock/rectal pain.  It is possible that baseline pain can be worsened by surgery, either with or without mesh. If surgery is vaginal, there is also a low risk of possible conversion to laparoscopy or open abdominal incision where indicated. Very rare risks include blood transfusion, blood clot, heart attack, pneumonia, or death.   There is also a risk of short-term postoperative urinary retention with need to use a catheter. About half of patients need to go home from surgery with a catheter, which is then later removed in the office. The risk of long-term need for a catheter is very low. There is also a risk of  worsening of overactive bladder.   Sling: The effectiveness of a midurethral vaginal mesh sling is approximately 85%, and thus, there will be times when you may leak urine after surgery, especially if your bladder is full or if you have a strong cough. There is a balance between making the sling tight enough to treat your leakage but not too tight so that you have long-term difficulty emptying your bladder. A mesh sling will not directly treat overactive bladder/urge incontinence and may worsen it.  There is an FDA safety notification on vaginal mesh procedures for prolapse but NOT mesh slings. We have extensive experience and training with mesh placement and we have close postoperative follow up to identify any potential complications from mesh. It is important to realize that this mesh is a permanent implant that cannot be easily removed. There are rare risks of mesh exposure (2-4%), pain with intercourse (0-7%), and infection (<1%). The risk of mesh exposure if more likely in a woman with risks for poor healing (prior radiation, poorly controlled diabetes, or immunocompromised). The risk of new or worsened chronic pain after mesh implant is more common in women with baseline chronic pain and/or poorly controlled anxiety or depression. Approximately 2-4% of patients will experience longer-term post-operative voiding dysfunction that may require surgical revision of the sling. We also reviewed that postoperatively, her stream may not be as strong as before surgery.     We discussed consent for blood products. Risks for blood transfusion include allergic reactions, other reactions that can affect different body organs and managed accordingly, transmission of infectious diseases such as HIV or Hepatitis. However, the blood is screened. Patient consents for blood products.  Pre-operative instructions:  She was instructed to not take Aspirin/NSAIDs x 7days prior to surgery. Antibiotic prophylaxis was ordered  as indicated.  Catheter use: Patient will go home with foley if needed after post-operative voiding trial.  Post-operative instructions:  She was provided with specific post-operative instructions, including precautions and signs/symptoms for which we would recommend contacting us, in addition to daytime and after-hours  contact phone numbers. This was provided on a handout.   Post-operative medications: Prescriptions for motrin, tylenol, miralax, and oxycodone were sent to her pharmacy. Discussed using ibuprofen and tylenol on a schedule to limit use of narcotics.   Laboratory testing:  We will check labs: As requested by anesthesia.   Preoperative clearance:  She does not require surgical clearance.    Post-operative follow-up:  A post-operative appointment will be made for 6 weeks from the date of surgery. If she needs a post-operative nurse visit for a voiding trial, that will be set up after she leaves the hospital.    Patient will call the clinic or use MyChart should anything change or any new issues arise.   Selmer Dominion, NP

## 2023-04-25 ENCOUNTER — Encounter (HOSPITAL_BASED_OUTPATIENT_CLINIC_OR_DEPARTMENT_OTHER): Payer: Self-pay | Admitting: Obstetrics and Gynecology

## 2023-04-29 ENCOUNTER — Encounter (HOSPITAL_BASED_OUTPATIENT_CLINIC_OR_DEPARTMENT_OTHER): Payer: Self-pay | Admitting: Obstetrics and Gynecology

## 2023-04-29 NOTE — Progress Notes (Addendum)
Spoke w/ via phone for pre-op interview--- pt Lab needs dos---- no              Lab results------ no COVID test -----patient states asymptomatic no test needed Arrive at ------- 1215 on 05-05-2023 NPO after MN NO Solid Food.  Clear liquids from MN until--- 1115 Med rec completed Medications to take morning of surgery ----- activella, incruse inhaler , imitrex if needed Diabetic medication ----- n/a Patient instructed no nail polish to be worn day of surgery Patient instructed to bring photo id and insurance card day of surgery Patient aware to have Driver (ride ) / caregiver    for 24 hours after surgery -- husband , Cassidy Suarez Patient Special Instructions ----- n/a Pre-Op special Instructions ----- n/a Patient verbalized understanding of instructions that were given at this phone interview. Patient denies shortness of breath, chest pain, fever, cough at this phone interview.

## 2023-05-05 ENCOUNTER — Encounter (HOSPITAL_BASED_OUTPATIENT_CLINIC_OR_DEPARTMENT_OTHER)
Admission: RE | Disposition: A | Discharge status: Home / Self Care | Payer: Self-pay | Source: Home / Self Care | Attending: Obstetrics and Gynecology

## 2023-05-05 ENCOUNTER — Ambulatory Visit (HOSPITAL_BASED_OUTPATIENT_CLINIC_OR_DEPARTMENT_OTHER): Payer: BC Managed Care – PPO

## 2023-05-05 ENCOUNTER — Ambulatory Visit (HOSPITAL_BASED_OUTPATIENT_CLINIC_OR_DEPARTMENT_OTHER)
Admission: RE | Admit: 2023-05-05 | Discharge: 2023-05-05 | Disposition: A | Discharge status: Home / Self Care | Payer: BC Managed Care – PPO | Attending: Obstetrics and Gynecology | Admitting: Obstetrics and Gynecology

## 2023-05-05 ENCOUNTER — Encounter (HOSPITAL_BASED_OUTPATIENT_CLINIC_OR_DEPARTMENT_OTHER): Payer: Self-pay | Admitting: Obstetrics and Gynecology

## 2023-05-05 DIAGNOSIS — Z87891 Personal history of nicotine dependence: Secondary | ICD-10-CM | POA: Diagnosis not present

## 2023-05-05 DIAGNOSIS — E782 Mixed hyperlipidemia: Secondary | ICD-10-CM | POA: Diagnosis not present

## 2023-05-05 DIAGNOSIS — Z01818 Encounter for other preprocedural examination: Secondary | ICD-10-CM

## 2023-05-05 DIAGNOSIS — J449 Chronic obstructive pulmonary disease, unspecified: Secondary | ICD-10-CM | POA: Insufficient documentation

## 2023-05-05 DIAGNOSIS — N393 Stress incontinence (female) (male): Secondary | ICD-10-CM | POA: Insufficient documentation

## 2023-05-05 DIAGNOSIS — M1990 Unspecified osteoarthritis, unspecified site: Secondary | ICD-10-CM | POA: Insufficient documentation

## 2023-05-05 HISTORY — DX: Personal history of other diseases of the respiratory system: Z87.09

## 2023-05-05 HISTORY — DX: Hyperlipidemia, unspecified: E78.5

## 2023-05-05 HISTORY — DX: Other chronic pain: G89.29

## 2023-05-05 HISTORY — DX: Headache, unspecified: R51.9

## 2023-05-05 HISTORY — DX: Presence of spectacles and contact lenses: Z97.3

## 2023-05-05 HISTORY — DX: Presence of dental prosthetic device (complete) (partial): Z97.2

## 2023-05-05 HISTORY — PX: CYSTOSCOPY: SHX5120

## 2023-05-05 HISTORY — PX: BLADDER SUSPENSION: SHX72

## 2023-05-05 HISTORY — DX: Stress incontinence (female) (male): N39.3

## 2023-05-05 HISTORY — DX: Centrilobular emphysema: J43.2

## 2023-05-05 SURGERY — URETHROPEXY, USING TRANSVAGINAL TAPE
Anesthesia: General | Site: Vagina

## 2023-05-05 MED ORDER — DEXAMETHASONE SODIUM PHOSPHATE 10 MG/ML IJ SOLN
INTRAMUSCULAR | Status: DC | PRN
  Administered 2023-05-05: 5 mg via INTRAVENOUS

## 2023-05-05 MED ORDER — GABAPENTIN 300 MG PO CAPS
ORAL_CAPSULE | ORAL | Status: AC
  Filled 2023-05-05: qty 1

## 2023-05-05 MED ORDER — CEFAZOLIN SODIUM-DEXTROSE 2-4 GM/100ML-% IV SOLN
INTRAVENOUS | Status: AC
  Filled 2023-05-05: qty 100

## 2023-05-05 MED ORDER — ACETAMINOPHEN 500 MG PO TABS
ORAL_TABLET | ORAL | Status: AC
  Filled 2023-05-05: qty 2

## 2023-05-05 MED ORDER — DROPERIDOL 2.5 MG/ML IJ SOLN: 0.6250 mg | Freq: Once | INTRAMUSCULAR | Status: DC | PRN

## 2023-05-05 MED ORDER — MIDAZOLAM HCL 5 MG/5ML IJ SOLN
INTRAMUSCULAR | Status: DC | PRN
  Administered 2023-05-05: 2 mg via INTRAVENOUS

## 2023-05-05 MED ORDER — OXYCODONE HCL 5 MG PO TABS
5.0000 mg | ORAL_TABLET | Freq: Once | ORAL | Status: AC | PRN
  Administered 2023-05-05: 5 mg via ORAL

## 2023-05-05 MED ORDER — ONDANSETRON HCL 4 MG/2ML IJ SOLN
INTRAMUSCULAR | Status: AC
  Filled 2023-05-05: qty 2

## 2023-05-05 MED ORDER — FENTANYL CITRATE (PF) 100 MCG/2ML IJ SOLN: 25.0000 ug | INTRAMUSCULAR | Status: DC | PRN

## 2023-05-05 MED ORDER — MIDAZOLAM HCL 2 MG/2ML IJ SOLN
INTRAMUSCULAR | Status: AC
  Filled 2023-05-05: qty 2

## 2023-05-05 MED ORDER — ACETAMINOPHEN 500 MG PO TABS
1000.0000 mg | ORAL_TABLET | ORAL | Status: AC
  Administered 2023-05-05: 1000 mg via ORAL

## 2023-05-05 MED ORDER — 0.9 % SODIUM CHLORIDE (POUR BTL) OPTIME
TOPICAL | Status: DC | PRN
  Administered 2023-05-05: 500 mL

## 2023-05-05 MED ORDER — EPHEDRINE SULFATE-NACL 50-0.9 MG/10ML-% IV SOSY
PREFILLED_SYRINGE | INTRAVENOUS | Status: DC | PRN
Start: 2023-05-05 — End: 2023-05-05
  Administered 2023-05-05: 10 mg via INTRAVENOUS

## 2023-05-05 MED ORDER — ACETAMINOPHEN 10 MG/ML IV SOLN: 1000.0000 mg | Freq: Once | INTRAVENOUS | Status: DC | PRN

## 2023-05-05 MED ORDER — DEXAMETHASONE SODIUM PHOSPHATE 10 MG/ML IJ SOLN
INTRAMUSCULAR | Status: AC
  Filled 2023-05-05: qty 1

## 2023-05-05 MED ORDER — FENTANYL CITRATE (PF) 100 MCG/2ML IJ SOLN
INTRAMUSCULAR | Status: AC
  Filled 2023-05-05: qty 2

## 2023-05-05 MED ORDER — LIDOCAINE-EPINEPHRINE 1 %-1:100000 IJ SOLN
INTRAMUSCULAR | Status: DC | PRN
  Administered 2023-05-05: 10 mL

## 2023-05-05 MED ORDER — LACTATED RINGERS IV SOLN: INTRAVENOUS | Status: DC

## 2023-05-05 MED ORDER — PROPOFOL 10 MG/ML IV BOLUS
INTRAVENOUS | Status: DC | PRN
Start: 2023-05-05 — End: 2023-05-05
  Administered 2023-05-05: 150 mg via INTRAVENOUS
  Administered 2023-05-05 (×2): 30 mg via INTRAVENOUS

## 2023-05-05 MED ORDER — PHENAZOPYRIDINE HCL 100 MG PO TABS
ORAL_TABLET | ORAL | Status: AC
  Filled 2023-05-05: qty 2

## 2023-05-05 MED ORDER — KETOROLAC TROMETHAMINE 30 MG/ML IJ SOLN
INTRAMUSCULAR | Status: AC
  Filled 2023-05-05: qty 1

## 2023-05-05 MED ORDER — OXYCODONE HCL 5 MG/5ML PO SOLN: 5.0000 mg | Freq: Once | ORAL | Status: AC | PRN

## 2023-05-05 MED ORDER — OXYCODONE HCL 5 MG PO TABS
ORAL_TABLET | ORAL | Status: AC
  Filled 2023-05-05: qty 1

## 2023-05-05 MED ORDER — PHENYLEPHRINE 80 MCG/ML (10ML) SYRINGE FOR IV PUSH (FOR BLOOD PRESSURE SUPPORT)
PREFILLED_SYRINGE | INTRAVENOUS | Status: AC
  Filled 2023-05-05: qty 10

## 2023-05-05 MED ORDER — PHENYLEPHRINE 80 MCG/ML (10ML) SYRINGE FOR IV PUSH (FOR BLOOD PRESSURE SUPPORT)
PREFILLED_SYRINGE | INTRAVENOUS | Status: DC | PRN
  Administered 2023-05-05: 160 ug via INTRAVENOUS
  Administered 2023-05-05: 80 ug via INTRAVENOUS
  Administered 2023-05-05: 240 ug via INTRAVENOUS

## 2023-05-05 MED ORDER — PHENAZOPYRIDINE HCL 100 MG PO TABS
200.0000 mg | ORAL_TABLET | ORAL | Status: AC
  Administered 2023-05-05: 200 mg via ORAL

## 2023-05-05 MED ORDER — CEFAZOLIN SODIUM-DEXTROSE 2-4 GM/100ML-% IV SOLN
2.0000 g | INTRAVENOUS | Status: AC
  Administered 2023-05-05: 2 g via INTRAVENOUS

## 2023-05-05 MED ORDER — PROPOFOL 10 MG/ML IV BOLUS
INTRAVENOUS | Status: AC
  Filled 2023-05-05: qty 20

## 2023-05-05 MED ORDER — ONDANSETRON HCL 4 MG/2ML IJ SOLN
INTRAMUSCULAR | Status: DC | PRN
Start: 2023-05-05 — End: 2023-05-05
  Administered 2023-05-05: 4 mg via INTRAVENOUS

## 2023-05-05 MED ORDER — FENTANYL CITRATE (PF) 100 MCG/2ML IJ SOLN
INTRAMUSCULAR | Status: DC | PRN
  Administered 2023-05-05: 50 ug via INTRAVENOUS
  Administered 2023-05-05 (×2): 25 ug via INTRAVENOUS

## 2023-05-05 MED ORDER — LIDOCAINE 2% (20 MG/ML) 5 ML SYRINGE
INTRAMUSCULAR | Status: DC | PRN
  Administered 2023-05-05: 80 mg via INTRAVENOUS

## 2023-05-05 MED ORDER — GABAPENTIN 300 MG PO CAPS
300.0000 mg | ORAL_CAPSULE | ORAL | Status: AC
  Administered 2023-05-05: 300 mg via ORAL

## 2023-05-05 MED ORDER — SODIUM CHLORIDE 0.9 % IR SOLN
Status: DC | PRN
  Administered 2023-05-05: 1000 mL via INTRAVESICAL

## 2023-05-05 MED ORDER — KETOROLAC TROMETHAMINE 30 MG/ML IJ SOLN
INTRAMUSCULAR | Status: DC | PRN
  Administered 2023-05-05: 15 mg via INTRAVENOUS

## 2023-05-05 MED ORDER — EPHEDRINE 5 MG/ML INJ
INTRAVENOUS | Status: AC
  Filled 2023-05-05: qty 5

## 2023-05-05 SURGICAL SUPPLY — 38 items
ADH SKN CLS APL DERMABOND .7 (GAUZE/BANDAGES/DRESSINGS) ×2
AGENT HMST KT MTR STRL THRMB (HEMOSTASIS)
BLADE CLIPPER SENSICLIP SURGIC (BLADE) ×2 IMPLANT
BLADE SURG 15 STRL LF DISP TIS (BLADE) ×2 IMPLANT
BLADE SURG 15 STRL SS (BLADE)
DERMABOND ADVANCED .7 DNX12 (GAUZE/BANDAGES/DRESSINGS) ×2 IMPLANT
ELECT REM PT RETURN 9FT ADLT (ELECTROSURGICAL) ×2
ELECTRODE REM PT RTRN 9FT ADLT (ELECTROSURGICAL) IMPLANT
GAUZE 4X4 16PLY ~~LOC~~+RFID DBL (SPONGE) IMPLANT
GLOVE BIOGEL PI IND STRL 6.5 (GLOVE) ×2 IMPLANT
GLOVE ECLIPSE 6.0 STRL STRAW (GLOVE) ×2 IMPLANT
GOWN STRL REUS W/TWL LRG LVL3 (GOWN DISPOSABLE) ×2 IMPLANT
HOLDER FOLEY CATH W/STRAP (MISCELLANEOUS) ×2 IMPLANT
IV NS 1000ML (IV SOLUTION) ×2
IV NS 1000ML BAXH (IV SOLUTION) IMPLANT
KIT TURNOVER CYSTO (KITS) ×2 IMPLANT
MANIFOLD NEPTUNE II (INSTRUMENTS) ×2 IMPLANT
NDL HYPO 22X1.5 SAFETY MO (MISCELLANEOUS) ×2 IMPLANT
NEEDLE HYPO 22X1.5 SAFETY MO (MISCELLANEOUS) ×2 IMPLANT
NS IRRIG 1000ML POUR BTL (IV SOLUTION) ×2 IMPLANT
NS IRRIG 500ML POUR BTL (IV SOLUTION) IMPLANT
PACK CYSTO (CUSTOM PROCEDURE TRAY) ×2 IMPLANT
PACK VAGINAL WOMENS (CUSTOM PROCEDURE TRAY) ×2 IMPLANT
RETRACTOR LONE STAR DISPOSABLE (INSTRUMENTS) ×2 IMPLANT
RETRACTOR STAY HOOK 5MM (MISCELLANEOUS) ×2 IMPLANT
SCRUB CHG 4% DYNA-HEX 4OZ (MISCELLANEOUS) ×2 IMPLANT
SET IRRIG Y TYPE TUR BLADDER L (SET/KITS/TRAYS/PACK) ×2 IMPLANT
SLEEVE SCD COMPRESS KNEE MED (STOCKING) ×2 IMPLANT
SPIKE FLUID TRANSFER (MISCELLANEOUS) ×2 IMPLANT
SUCTION TUBE FRAZIER 10FR DISP (SUCTIONS) ×2 IMPLANT
SURGIFLO W/THROMBIN 8M KIT (HEMOSTASIS) IMPLANT
SUT ABS MONO DBL WITH NDL 48IN (SUTURE) IMPLANT
SUT VIC AB 2-0 SH 27 (SUTURE) ×2
SUT VIC AB 2-0 SH 27XBRD (SUTURE) ×2 IMPLANT
SYR BULB EAR ULCER 3OZ GRN STR (SYRINGE) ×2 IMPLANT
SYS SLING ADV FIT BLUE TRNSVAG (Sling) IMPLANT
TOWEL OR 17X24 6PK STRL BLUE (TOWEL DISPOSABLE) ×2 IMPLANT
TRAY FOLEY W/BAG SLVR 14FR LF (SET/KITS/TRAYS/PACK) ×2 IMPLANT

## 2023-05-05 NOTE — Transfer of Care (Signed)
Immediate Anesthesia Transfer of Care Note  Patient: EMLYN EVETTS  Procedure(s) Performed: TRANSVAGINAL TAPE (TVT) PROCEDURE (Vagina ) CYSTOSCOPY (Bladder)  Patient Location: PACU  Anesthesia Type:General  Level of Consciousness: patient cooperative and responds to stimulation  Airway & Oxygen Therapy: Patient Spontanous Breathing and Patient connected to nasal cannula oxygen  Post-op Assessment: Report given to RN and Post -op Vital signs reviewed and stable  Post vital signs: Reviewed and stable  Last Vitals:  Vitals Value Taken Time  BP    Temp    Pulse 84 05/05/23 1412  Resp 15 05/05/23 1412  SpO2 97 % 05/05/23 1412  Vitals shown include unfiled device data.  Last Pain:  Vitals:   05/05/23 1234  TempSrc: Oral  PainSc: 0-No pain      Patients Stated Pain Goal: 4 (05/05/23 1234)  Complications: No notable events documented.

## 2023-05-05 NOTE — Interval H&P Note (Signed)
History and Physical Interval Note:  05/05/2023 1:11 PM  Cassidy Suarez  has presented today for surgery, with the diagnosis of stress urinary incontinence.  The various methods of treatment have been discussed with the patient and family. After consideration of risks, benefits and other options for treatment, the patient has consented to  Procedure(s) with comments: TRANSVAGINAL TAPE (TVT) PROCEDURE (N/A) CYSTOSCOPY (N/A) as a surgical intervention.  The patient's history has been reviewed, patient examined, no change in status, stable for surgery.  I have reviewed the patient's chart and labs.  Questions were answered to the patient's satisfaction.     Marguerita Beards

## 2023-05-05 NOTE — Anesthesia Preprocedure Evaluation (Signed)
Anesthesia Evaluation  Patient identified by MRN, date of birth, ID band Patient awake    Reviewed: Allergy & Precautions, H&P , NPO status , Patient's Chart, lab work & pertinent test results  History of Anesthesia Complications Negative for: history of anesthetic complications  Airway Mallampati: II  TM Distance: >3 FB Neck ROM: Full    Dental no notable dental hx.    Pulmonary COPD, former smoker   Pulmonary exam normal breath sounds clear to auscultation       Cardiovascular negative cardio ROS Normal cardiovascular exam Rhythm:Regular Rate:Normal     Neuro/Psych  Headaches  negative psych ROS   GI/Hepatic negative GI ROS, Neg liver ROS,,,  Endo/Other  negative endocrine ROS    Renal/GU negative Renal ROS   Hx of hysterectomy    Musculoskeletal  (+) Arthritis ,    Abdominal   Peds negative pediatric ROS (+)  Hematology negative hematology ROS (+)   Anesthesia Other Findings   Reproductive/Obstetrics negative OB ROS                             Anesthesia Physical Anesthesia Plan  ASA: 3  Anesthesia Plan: General   Post-op Pain Management:    Induction: Intravenous  PONV Risk Score and Plan:   Airway Management Planned: LMA  Additional Equipment:   Intra-op Plan:   Post-operative Plan: Extubation in OR  Informed Consent: I have reviewed the patients History and Physical, chart, labs and discussed the procedure including the risks, benefits and alternatives for the proposed anesthesia with the patient or authorized representative who has indicated his/her understanding and acceptance.     Dental advisory given  Plan Discussed with: CRNA  Anesthesia Plan Comments:        Anesthesia Quick Evaluation

## 2023-05-05 NOTE — Anesthesia Postprocedure Evaluation (Signed)
Anesthesia Post Note  Patient: Cassidy Suarez  Procedure(s) Performed: TRANSVAGINAL TAPE (TVT) PROCEDURE (Vagina ) CYSTOSCOPY (Bladder)     Patient location during evaluation: PACU Anesthesia Type: General Level of consciousness: awake and alert Pain management: pain level controlled Vital Signs Assessment: post-procedure vital signs reviewed and stable Respiratory status: spontaneous breathing, nonlabored ventilation, respiratory function stable and patient connected to nasal cannula oxygen Cardiovascular status: blood pressure returned to baseline and stable Postop Assessment: no apparent nausea or vomiting Anesthetic complications: no   No notable events documented.  Last Vitals:  Vitals:   05/05/23 1234 05/05/23 1412  BP: 134/73 97/82  Pulse: 62   Resp: 18   Temp: 36.6 C (!) 36.2 C  SpO2: 98%     Last Pain:  Vitals:   05/05/23 1415  TempSrc:   PainSc: 4                   [place]

## 2023-05-05 NOTE — Discharge Instructions (Addendum)
POST OPERATIVE INSTRUCTIONS  General Instructions Recovery (not bed rest) will last approximately 6 weeks Walking is encouraged, but refrain from strenuous exercise/ housework/ heavy lifting for 2 weeks. No lifting >10lbs  Nothing in the vagina- NO intercourse, tampons or douching for 6 weeks Bathing:  Do not submerge in water (NO swimming, bath, hot tub, etc) until after your postop visit. You can shower starting the day after surgery.  No driving until you are not taking narcotic pain medicine and until your pain is well enough controlled that you can slam on the breaks or make sudden movements if needed.   Taking your medications Please take your acetaminophen and ibuprofen on a schedule for the first 48 hours. Take 600mg  ibuprofen, then take 500mg  acetaminophen 3 hours later, then continue to alternate ibuprofen and acetaminophen. That way you are taking each type of medication every 6 hours. Take the prescribed narcotic (oxycodone, tramadol, etc) as needed, with a maximum being every 4 hours.  Take a stool softener daily to keep your stools soft and preventing you from straining. If you have diarrhea, you decrease your stool softener. This is explained more below. We have prescribed you Miralax.  Reasons to Call the Nurse (see last page for phone numbers) Heavy Bleeding (changing your pad every 1-2 hours) Persistent nausea/vomiting Fever (100.4 degrees or more) Incision problems (pus or other fluid coming out, redness, warmth, increased pain)  Things to Expect After Surgery Mild to Moderate pain is normal during the first day or two after surgery. If prescribed, take Ibuprofen or Tylenol first and use the stronger medicine for "break-through" pain. You can overlap these medicines because they work differently.   Constipation   To Prevent Constipation:  Eat a well-balanced diet including protein, grains, fresh fruit and vegetables.  Drink plenty of fluids. Walk regularly.  Depending on  specific instructions from your physician: take Miralax daily and additionally you can add a stool softener (colace/ docusate) and fiber supplement. Continue as long as you're on pain medications.   To Treat Constipation:  If you do not have a bowel movement in 2 days after surgery, you can take 2 Tbs of Milk of Magnesia 1-2 times a day until you have a bowel movement. If diarrhea occurs, decrease the amount or stop the laxative. If no results with Milk of Magnesia, you can drink a bottle of magnesium citrate which you can purchase over the counter.  Fatigue:  This is a normal response to surgery and will improve with time.  Plan frequent rest periods throughout the day.  Gas Pain:  This is very common but can also be very painful! Drink warm liquids such as herbal teas, bouillon or soup. Walking will help you pass more gas.  Mylicon or Gas-X can be taken over the counter.  Leaking Urine:  Varying amounts of leakage may occur after surgery.  This should improve with time. Your bladder needs at least 3 months to recover from surgery. If you leak after surgery, be sure to mention this to your doctor at your post-op visit. If you were taking medications for overactive bladder prior to surgery, be sure to restart the medications immediately after surgery.  Incisions: If you have incisions on your abdomen, the skin glue will dissolve on its own over time. It is ok to gently rinse with soap and water over these incisions but do not scrub.  Catheter Approximately 50% of patients are unable to urinate after surgery and need to go home with a  catheter. This allows your bladder to rest so it can return to full function. If you go home with a catheter, the office will call to set up a voiding trial a few days after surgery. For most patients, by this visit, they are able to urinate on their own. Long term catheter use is rare.   Return to Work  As work demands and recovery times vary widely, it is hard to  predict when you will want to return to work. If you have a desk job with no strenuous physical activity, and if you would like to return sooner than generally recommended, discuss this with your provider or call our office.   Post op concerns  For non-emergent issues, please call the Urogynecology Nurse. Please leave a message and someone will contact you within one business day.  You can also send a message through MyChart.   AFTER HOURS (After 5:00 PM and on weekends):  For urgent matters that cannot wait until the next business day. Call our office [phone] and connect to the doctor on call.  Please reserve this for important issues.   **FOR ANY TRUE EMERGENCY ISSUES CALL 911 OR GO TO THE NEAREST EMERGENCY ROOM.** Please inform our office or the doctor on call of any emergency.     APPOINTMENTS: Call [phone]  Post Anesthesia Home Care Instructions  Activity: Get plenty of rest for the remainder of the day. A responsible individual must stay with you for 24 hours following the procedure.  For the next 24 hours, DO NOT: -Drive a car -Advertising copywriter -Drink alcoholic beverages -Take any medication unless instructed by your physician -Make any legal decisions or sign important papers.  Meals: Start with liquid foods such as gelatin or soup. Progress to regular foods as tolerated. Avoid greasy, spicy, heavy foods. If nausea and/or vomiting occur, drink only clear liquids until the nausea and/or vomiting subsides. Call your physician if vomiting continues.  Special Instructions/Symptoms: Your throat may feel dry or sore from the anesthesia or the breathing tube placed in your throat during surgery. If this causes discomfort, gargle with warm salt water. The discomfort should disappear within 24 hours.    No acetaminophen/Tylenol until after 6:45 pm today if needed. No ibuprofen, Advil, Aleve, Motrin, ketorolac, meloxicam, naproxen, or other NSAIDS until after 7:45 pm today  if needed.

## 2023-05-05 NOTE — Op Note (Signed)
Operative Note  Preoperative Diagnosis: stress urinary incontinence  Postoperative Diagnosis: same  Procedures performed:  Midurethral sling, cystoscopy  Implants:  Implant Name Type Inv. Item Serial No. Manufacturer Lot No. LRB No. Used Action  SYS SLING ADV FIT BLUE TRNSVAG - [id number] Sling SYS SLING ADV FIT BLUE TRNSVAG  [employer] [id number] N/A 1 Implanted    Attending Surgeon: Lanetta Inch, MD   Anesthesia: General LMA  Findings: On cystoscopy, normal bladder and urethra without injury, lesion or foreign body. Brisk bilateral ureteral efflux noted.     Specimens: none  Estimated blood loss: 20 mL  IV fluids: 500 mL  Urine output: 75 mL  Complications: none  Procedure in Detail:  After informed consent was obtained, the patient was taken to the operating room where she was placed under anesthesia.  She was then placed in the dorsal lithotomy position with allen stirrups,  and prepped and draped in the usual sterile fashion.  A strap was placed across her upper abdomen on top of her gown so it was not in direct contact with her skin.  Care was taken to avoid hyperflexion or hyperextension of her upper and lower extremities. A foley catheter was placed.  A lonestar self-retraining retractor was placed with 4 stay hooks. The mid urethral area was located on the anterior vaginal wall.  Two Allis clamps were placed at the level of the midurethra. 1% lidocaine with epinephrine was injected into the vaginal mucosa. A vertical incision was made between the two clamps using a 15-blade scalpel.  Using sharp dissection, Metzenbaum scissors were used to make a periurethral tunnel from the vaginal incision towards the pubic rami bilaterally for the future sling tracts. The bladder was ensured to be empty. The trocar and attached sling were introduced into the right side of the periurethral vaginal incision, just inferior to the pubic symphysis on the right side. The trocar was  guided through the endopelvic fascia and directly vertically.  While hugging the cephalad surface of the pubic bone, the trocar was guided out through the abdomen 2 fingerbreadths lateral to midline at the level of the pubic symphysis on the ipsilateral side. The trocar was placed on the left side in a similar fashion.  A 70-degree cystoscope was introduced, and 360-degree inspection revealed no trauma or trocars in the bladder, with brisk bilateral ureteral efflux.  The bladder was drained and the cystoscope was removed.  The Foley catheter was reinserted.  The sling was brought to lie beneath the mid-urethra.  A needle driver was placed behind the sling to ensure no tension.   The plastic sheath was removed from the sling and the distal ends of the sling were trimmed just below the level of the skin incisions.  Tension-free positioning of the sling was confirmed. Vaginal inspection revealed no vaginotomy or sling perforations of the mucosa.  The vaginal mucosal edges were reapproximated using 2-0 Vicryl.  The vagina was copiously irrigated.  Hemostasis was again noted. Vaginal packing not placed. The suprapubic sling incisions were closed with Dermabond. The patient tolerated the procedure well.  She was awakened from anesthesia and transferred to the recovery room in stable condition. All needle and sponge counts were correct x 2.     Marguerita Beards, MD

## 2023-05-05 NOTE — Anesthesia Procedure Notes (Signed)
Procedure Name: LMA Insertion Date/Time: 05/05/2023 1:24 PM  Performed by: Bishop Limbo, CRNAPre-anesthesia Checklist: Patient identified, Emergency Drugs available, Suction available and Patient being monitored Patient Re-evaluated:Patient Re-evaluated prior to induction Oxygen Delivery Method: Circle System Utilized Preoxygenation: Pre-oxygenation with 100% oxygen Induction Type: IV induction Ventilation: Mask ventilation without difficulty LMA: LMA inserted LMA Size: 4.0 Number of attempts: 1 Placement Confirmation: positive ETCO2 Tube secured with: Tape Dental Injury: Teeth and Oropharynx as per pre-operative assessment

## 2023-05-06 ENCOUNTER — Encounter (HOSPITAL_BASED_OUTPATIENT_CLINIC_OR_DEPARTMENT_OTHER): Payer: Self-pay | Admitting: Obstetrics and Gynecology

## 2023-05-06 ENCOUNTER — Telehealth: Payer: Self-pay | Admitting: Obstetrics and Gynecology

## 2023-05-06 NOTE — Telephone Encounter (Signed)
Cassidy Suarez underwent sling and cystoscopy on 05/05/23.   She passed her voiding trial.  was backfilled into the bladder Voided  PVR by bladder scan was .   She was discharged without a catheter. Please call her for a routine post op check . Thanks!  Marguerita Beards, MD

## 2023-05-06 NOTE — Telephone Encounter (Signed)
Post- Op Call  Cassidy Suarez underwent sling and cystoscopy on 05/05/2023 with Dr Florian Buff. The patient reports that her pain is controlled. She is taking Tylenol, ibuprofen and oxycodone 5mg  for pain. Pt said she has 3 left. She had minimal vaginal bleeding. She has not had a bowel movement and is taking Miralax for a bowel regimen. She was discharged without a catheter and will return on 06/13/23 for a post op  [name], CMA

## 2023-05-09 ENCOUNTER — Encounter: Payer: Self-pay | Admitting: Obstetrics and Gynecology

## 2023-05-14 ENCOUNTER — Telehealth: Payer: Self-pay | Admitting: Family Medicine

## 2023-05-14 NOTE — Telephone Encounter (Signed)
Copied from CRM [id number]. Topic: General - Other >> May 14, 2023 11:27 AM Franchot Heidelberg wrote: Reason for CRM: Pt called requesting feedback on bill she received in the mail regarding a heart monitor from [employer]. Has questions, she believes this was done incorrectly.   Best contact: [phone]

## 2023-05-27 DIAGNOSIS — M25551 Pain in right hip: Secondary | ICD-10-CM | POA: Diagnosis not present

## 2023-05-27 DIAGNOSIS — M25552 Pain in left hip: Secondary | ICD-10-CM | POA: Diagnosis not present

## 2023-05-28 DIAGNOSIS — M79671 Pain in right foot: Secondary | ICD-10-CM | POA: Diagnosis not present

## 2023-05-28 DIAGNOSIS — Z1239 Encounter for other screening for malignant neoplasm of breast: Secondary | ICD-10-CM | POA: Diagnosis not present

## 2023-05-28 DIAGNOSIS — I700 Atherosclerosis of aorta: Secondary | ICD-10-CM | POA: Diagnosis not present

## 2023-05-28 DIAGNOSIS — Z1211 Encounter for screening for malignant neoplasm of colon: Secondary | ICD-10-CM | POA: Diagnosis not present

## 2023-05-28 DIAGNOSIS — J432 Centrilobular emphysema: Secondary | ICD-10-CM | POA: Diagnosis not present

## 2023-05-28 DIAGNOSIS — R002 Palpitations: Secondary | ICD-10-CM | POA: Diagnosis not present

## 2023-05-28 DIAGNOSIS — E782 Mixed hyperlipidemia: Secondary | ICD-10-CM | POA: Diagnosis not present

## 2023-06-13 ENCOUNTER — Encounter: Payer: BC Managed Care – PPO | Admitting: Obstetrics and Gynecology

## 2023-06-18 ENCOUNTER — Ambulatory Visit (INDEPENDENT_AMBULATORY_CARE_PROVIDER_SITE_OTHER): Payer: BC Managed Care – PPO | Admitting: Obstetrics and Gynecology

## 2023-06-18 ENCOUNTER — Encounter: Payer: Self-pay | Admitting: Obstetrics and Gynecology

## 2023-06-18 VITALS — BP 123/81 | HR 79

## 2023-06-18 DIAGNOSIS — Z9889 Other specified postprocedural states: Secondary | ICD-10-CM

## 2023-06-18 DIAGNOSIS — Z48816 Encounter for surgical aftercare following surgery on the genitourinary system: Secondary | ICD-10-CM

## 2023-06-18 NOTE — Progress Notes (Signed)
[facility]  Date of Visit: 06/18/2023  History of Present Illness: Cassidy Suarez is a 62 y.o. female scheduled today for a post-operative visit.   Surgery: s/p Midurethral sling, cystoscopy on 05/05/23  She passed her postoperative void trial.   Postoperative course has been uncomplicated.   Today she reports she is doing well. Has no leakage and not wearing pads. Her urine stream does take longer but she empties well.   UTI in the last 6 weeks? No  Pain? No  She has returned to her normal activity (except for postop restrictions) Vaginal bulge? No  Stress incontinence: No  Urgency/frequency: No  Urge incontinence: No  Voiding dysfunction: No  Bowel issues: No - had some initial constipation which has improved, needs miralax more often   Medications: She has a current medication list which includes the following prescription(s): estradiol-norethindrone, sumatriptan, umeclidinium bromide, and rosuvastatin.   Allergies: Patient has No Known Allergies.   Physical Exam: BP 123/81 (BP Location: Right Arm)   Pulse 79    Pelvic Examination: Vagina: Incisions healing well. Sutures are not present at incision line and there is not granulation tissue. No tenderness along the anterior or posterior vagina. No apical tenderness. No pelvic masses. No visible or palpable mesh.  POP-Q: deferred  ---------------------------------------------------------  Assessment and Plan:  1. Post-operative state     - Well healed, has a good result - Can resume regular activity including exercise and intercourse,  if desired.   Return as needed  Marguerita Beards, MD

## 2023-07-22 DIAGNOSIS — Z1239 Encounter for other screening for malignant neoplasm of breast: Secondary | ICD-10-CM | POA: Diagnosis not present

## 2023-07-22 DIAGNOSIS — Z1231 Encounter for screening mammogram for malignant neoplasm of breast: Secondary | ICD-10-CM | POA: Diagnosis not present

## 2023-10-03 ENCOUNTER — Other Ambulatory Visit: Payer: Self-pay | Admitting: Obstetrics and Gynecology

## 2023-10-03 DIAGNOSIS — Z79890 Hormone replacement therapy (postmenopausal): Secondary | ICD-10-CM

## 2023-10-17 ENCOUNTER — Other Ambulatory Visit: Payer: Self-pay

## 2023-10-17 DIAGNOSIS — Z79890 Hormone replacement therapy (postmenopausal): Secondary | ICD-10-CM

## 2023-10-17 MED ORDER — ESTRADIOL-NORETHINDRONE ACET 1-0.5 MG PO TABS: 1.0000 | ORAL_TABLET | Freq: Every day | ORAL | 0 refills | Status: DC

## 2023-11-03 ENCOUNTER — Encounter: Payer: Self-pay | Admitting: Obstetrics and Gynecology

## 2023-11-03 DIAGNOSIS — Z9189 Other specified personal risk factors, not elsewhere classified: Secondary | ICD-10-CM | POA: Insufficient documentation

## 2023-11-03 NOTE — Progress Notes (Deleted)
 PCP: Karie Georges Pap, MD   No chief complaint on file.   HPI:      Cassidy Suarez is a 63 y.o. [id number] who LMP was No LMP recorded. Patient has had a hysterectomy., presents today for her annual examination.  Her menses are absent due to TAH for CPP yrs ago. She does not have postmenopausal bleeding.  She does have vasomotor sx/night sweats. She uses activella with some sx improvement with moods, but never had relief of night sweats. Doesn't want to stop HRT however.  No side effects.   Sex activity: single partner, contraception - status post hysterectomy. She does have vaginal dryness, uses lubricants with relief. Has always had pain with sex. Did TAH for sx without relief.   Last Pap: 06/25/22  Results were: no abnormalities /neg HPV DNA 2018. No longer indicated Hx of STDs: none  Last mammogram: 07/22/23 at [facility]: Results were: normal--routine follow-up in 12 months.  There is a FH of breast cancer in her mother (triple neg), mat aunt, and mat 2nd cousins, as well as PGM and pat aunt. Pt is MyRisk neg 10/23;  IBIS=24.8%/riskscore=25.4%. There is no FH of ovarian cancer. The patient does do self-breast exams.  Colonoscopy: colonoscopy 9 years ago without abnormalities. Pt followed by PCP for colonoscopy sched.   Having issues with painful area along coccyx  intermittently for several yrs. Sx c/w pilonidal cyst last yr.   Tobacco use: The patient denies current or previous tobacco use. Alcohol use: none No drug use. Exercise: min active  She does get adequate calcium but not Vitamin D in her diet.  Still having issues with SUI, incontinence with walking, sitting and during sex without sensation to void. Saw Dr. Tiburcio Pea in past who suggested surgery. Pt ready for surgery, but Dr. Tiburcio Pea no longer here. Never did pelvic PT, was told to use ben wa balls but they're too painful for pt. S/p transvag tape procedure for SUI with urogyn. 8/24.   Labs with PCP  Doing therapy for loss  of parents, still grieving. Wearing heart monitor now for heart palpitations.   Past Medical History:  Diagnosis Date   Arthritis    bilateral hip and elbow pain   BRCA negative 06/2022   MyRisk neg   Centriacinar emphysema (HCC)    followed by pcp   Chronic headache    Family history of breast cancer    History of head injury 05/01/2019   evaluated by neurologist-- dr Terrace Arabia (note in epic 05-04-2019;  incident happened 05-01-2019 ziplining w/ helmet when stopped was jolted, acute onset left visual field disturbance/ left upper extremity weakness;   MRI brain/ orbits normal   History of pneumothorax 1988   w/  lung blebs  treated w/ chest tube   Hyperlipidemia    in epic nuclear stress test 05-15-2010  normal perfusion no ischemia, nuclear ef 70%   Increased risk of breast cancer 06/2022   IBIS=24.8%/riskscore=25.4%   SUI (stress urinary incontinence, female)    Wears dentures    full upper   Wears glasses     Past Surgical History:  Procedure Laterality Date   BLADDER SUSPENSION N/A 05/05/2023   Procedure: TRANSVAGINAL TAPE (TVT) PROCEDURE;  Surgeon: Marguerita Beards, MD;  Location: [facility];  Service: Gynecology;  Laterality: N/A;  Total time requested is 1 hour   CESAREAN SECTION     x2  last one 1992  with Bilateral Tubal Ligation   CYSTOSCOPY N/A 05/05/2023  Procedure: CYSTOSCOPY;  Surgeon: Marguerita Beards, MD;  Location: [facility];  Service: Gynecology;  Laterality: N/A;   ESOPHAGOGASTRODUODENOSCOPY  2013   @[facility]    LAPAROSCOPIC ASSISTED VAGINAL HYSTERECTOMY  2001   W/  BILATERAL SALPINGOOPHORECTOMY    Family History  Problem Relation Age of Onset   Diabetes Mother        DM Type 2   Stroke Mother    Breast cancer Mother 30       triple neg   Heart disease Mother    Hypertension Mother    Alzheimer's disease Mother    Heart disease Father    Hypertension Father    Diabetes Father        DM Type 2   Transient  ischemic attack Father    Breast cancer Paternal Grandmother        not sure of age   Breast cancer Maternal Aunt        43s   Lung cancer Cousin    Breast cancer Other    Breast cancer Paternal Aunt 58    Social History   Socioeconomic History   Marital status: Married    Spouse name: Not on file   Number of children: 2   Years of education: college   Highest education level: Not on file  Occupational History   Occupation: community navigator  Tobacco Use   Smoking status: Former    Current packs/day: 0.00    Average packs/day: 1 pack/day for 30.0 years (30.0 ttl pk-yrs)    Types: Cigarettes    Start date: 09/17/1969    Quit date: 09/18/1999    Years since quitting: 24.1   Smokeless tobacco: Never   Tobacco comments:    04-29-2023  per pt quit  2001, smoked for 30 yrs  Vaping Use   Vaping status: Never Used  Substance and Sexual Activity   Alcohol use: Yes    Comment: social   Drug use: Never   Sexual activity: Yes    Birth control/protection: Surgical, Post-menopausal    Comment: Hysterectomy  Other Topics Concern   Not on file  Social History Narrative   Lives at home with her husband.  Her parents live with her right now.  She is their caregiver.   Two cups coffee, one soda per day.   Right-handed.   Social Drivers of Corporate investment banker Strain: Low Risk  (06/13/2022)   Received from [facility], [facility]   Overall Financial Resource Strain (CARDIA)    Difficulty of Paying Living Expenses: Not hard at all  Food Insecurity: No Food Insecurity (06/13/2022)   Received from [facility], [facility]   Hunger Vital Sign    Worried About Running Out of Food in the Last Year: Never true    Ran Out of Food in the Last Year: Never true  Transportation Needs: No Transportation Needs (06/13/2022)   Received from [facility], [facility]   [facility] - Transportation    Lack of Transportation (Medical): No    Lack of Transportation  (Non-Medical): No  Physical Activity: Not on file  Stress: No Stress Concern Present (07/18/2022)   Received from [facility], [facility]   [facility] - Occupational Stress Questionnaire    Feeling of Stress : Only a little  Social Connections: Not on file  Intimate Partner Violence: Not At Risk (07/18/2022)   Received from [facility],  [facility]   Humiliation, Afraid, Rape, and Kick questionnaire    Fear of Current or Ex-Partner: No    Emotionally Abused: No    Physically Abused: No    Sexually Abused: No    No outpatient medications have been marked as taking for the 11/04/23 encounter (Appointment) with Meliah Appleman, Ilona Sorrel, PA-C.     ROS:  Review of Systems  Constitutional:  Negative for fatigue, fever and unexpected weight change.  Respiratory:  Negative for cough, shortness of breath and wheezing.   Cardiovascular:  Positive for palpitations. Negative for chest pain and leg swelling.  Gastrointestinal:  Negative for blood in stool, constipation, diarrhea, nausea and vomiting.  Endocrine: Negative for cold intolerance, heat intolerance and polyuria.  Genitourinary:  Positive for dyspareunia. Negative for dysuria, flank pain, frequency, genital sores, hematuria, menstrual problem, pelvic pain, urgency, vaginal bleeding, vaginal discharge and vaginal pain.  Musculoskeletal:  Negative for back pain, joint swelling and myalgias.  Skin:  Negative for color change and rash.  Neurological:  Positive for headaches. Negative for dizziness, syncope, light-headedness and numbness.  Hematological:  Negative for adenopathy.  Psychiatric/Behavioral:  Positive for agitation and dysphoric mood. Negative for confusion, sleep disturbance and suicidal ideas. The patient is not nervous/anxious.      Objective: There were no vitals taken for this visit.   Physical Exam Constitutional:      Appearance: She is well-developed.  Genitourinary:      Vulva normal.     Genitourinary Comments: UTERUS/CX SURG REM     Right Labia: No rash, tenderness or lesions.    Left Labia: No tenderness, lesions or rash.    Vaginal cuff intact.    No vaginal discharge, erythema or tenderness.      Right Adnexa: not tender and no mass present.    Left Adnexa: not tender and no mass present.    Cervix is absent.     Uterus is absent.  Breasts:    Right: No mass, nipple discharge, skin change or tenderness.     Left: No mass, nipple discharge, skin change or tenderness.  Neck:     Thyroid: No thyromegaly.  Cardiovascular:     Rate and Rhythm: Normal rate and regular rhythm.     Heart sounds: Normal heart sounds. No murmur heard. Pulmonary:     Effort: Pulmonary effort is normal.     Breath sounds: Normal breath sounds.  Abdominal:     Palpations: Abdomen is soft.     Tenderness: There is no abdominal tenderness. There is no guarding.  Musculoskeletal:        General: Normal range of motion.     Cervical back: Normal range of motion.  Neurological:     General: No focal deficit present.     Mental Status: She is alert and oriented to person, place, and time.     Cranial Nerves: No cranial nerve deficit.  Skin:    General: Skin is warm and dry.  Psychiatric:        Mood and Affect: Mood normal.        Behavior: Behavior normal.        Thought Content: Thought content normal.        Judgment: Judgment normal.  Vitals reviewed.     Assessment/Plan: Encounter for annual routine gynecological examination  Cervical cancer screening - Plan: Cytology - PAP  Encounter for screening mammogram for malignant neoplasm of breast; pt has appt  Family history of breast cancer -  Plan: [facility]); MyRisk testing discussed and done today. Will call with results.   Hormone replacement therapy (HRT) - Plan: estradiol-norethindrone (ACTIVELLA) 1-0.5 MG tablet; Rx RF. Pt still with night sweats but don't want to  increase HRT dose. Pt wants to continue HRT  Pilonidal cyst--can do gen surg ref prn. Not problematic now.   SUI (stress urinary incontinence, female) - Plan: Ambulatory referral to Urogynecology; refer to urogyn at [facility] per pt request for mgmt/tx.   Urinary incontinence without sensory awareness - Plan: Ambulatory referral to Urogynecology    No orders of the defined types were placed in this encounter.           GYN counsel breast self exam, mammography screening, adequate intake of calcium and vitamin D, diet and exercise    F/U  No follow-ups on file.  Decker Cogdell B. Angeli Demilio, PA-C 11/03/2023 4:34 PM

## 2023-11-04 ENCOUNTER — Ambulatory Visit: Payer: BC Managed Care – PPO | Admitting: Obstetrics and Gynecology

## 2023-11-04 DIAGNOSIS — Z9189 Other specified personal risk factors, not elsewhere classified: Secondary | ICD-10-CM

## 2023-11-04 DIAGNOSIS — Z79890 Hormone replacement therapy (postmenopausal): Secondary | ICD-10-CM

## 2023-11-04 DIAGNOSIS — Z1231 Encounter for screening mammogram for malignant neoplasm of breast: Secondary | ICD-10-CM

## 2023-11-04 DIAGNOSIS — Z01419 Encounter for gynecological examination (general) (routine) without abnormal findings: Secondary | ICD-10-CM

## 2023-11-04 DIAGNOSIS — N3942 Incontinence without sensory awareness: Secondary | ICD-10-CM

## 2023-11-04 DIAGNOSIS — Z803 Family history of malignant neoplasm of breast: Secondary | ICD-10-CM

## 2023-11-04 DIAGNOSIS — Z1211 Encounter for screening for malignant neoplasm of colon: Secondary | ICD-10-CM

## 2023-11-04 DIAGNOSIS — N393 Stress incontinence (female) (male): Secondary | ICD-10-CM

## 2023-11-06 ENCOUNTER — Encounter: Payer: Self-pay | Admitting: Obstetrics and Gynecology

## 2023-12-12 ENCOUNTER — Other Ambulatory Visit: Payer: Self-pay | Admitting: Obstetrics and Gynecology

## 2023-12-12 DIAGNOSIS — Z79890 Hormone replacement therapy (postmenopausal): Secondary | ICD-10-CM

## 2023-12-16 ENCOUNTER — Telehealth: Payer: Self-pay

## 2023-12-16 DIAGNOSIS — Z79890 Hormone replacement therapy (postmenopausal): Secondary | ICD-10-CM

## 2023-12-16 MED ORDER — ESTRADIOL-NORETHINDRONE ACET 1-0.5 MG PO TABS: 1.0000 | ORAL_TABLET | Freq: Every day | ORAL | 0 refills | Status: DC

## 2023-12-16 NOTE — Telephone Encounter (Signed)
 Cassidy Suarez was calling triage asking for estradiol refill, I advised I could send in one refill but she has to attend her appointment on the 15th to get more refills. Patient understood

## 2023-12-29 ENCOUNTER — Encounter: Payer: Self-pay | Admitting: Obstetrics and Gynecology

## 2023-12-29 NOTE — Progress Notes (Unsigned)
 PCP: Karie Georges Pap, MD   No chief complaint on file.   HPI:      [name] is a 63 y.o. [phone] who LMP was No LMP recorded. Patient has had a hysterectomy., presents today for her annual examination.  Her menses are absent due to TAH for CPP yrs ago. She does not have postmenopausal bleeding.  She does have vasomotor sx/night sweats. She uses activella with some sx improvement with moods, but never had relief of night sweats. Doesn't want to stop HRT however.  No side effects.   Sex activity: single partner, contraception - status post hysterectomy. She does have vaginal dryness, uses lubricants with relief. Has always had pain with sex. Did TAH for sx without relief.   Last Pap: 06/17/17  Results were: no abnormalities /neg HPV DNA. No longer indicated Hx of STDs: none  Last mammogram: 07/22/23 at [facility]; Results were: normal--routine follow-up in 12 months.  There is a FH of breast cancer in her mother (triple neg), mat aunt, and mat 2nd cousins, as well as PGM and pat aunt. Pt is MyRisk neg 10/23; IBIS=24.8%/riskscore=25.4%    .  There is no FH of ovarian cancer. The patient does do self-breast exams.  Colonoscopy: colonoscopy >10 years ago without abnormalities. Pt followed by PCP for colonoscopy sched. Has appt 4/25   Having issues with painful area along coccyx  intermittently for several yrs. Sx c/w pilonidal cyst last yr.   Tobacco use: The patient denies current or previous tobacco use. Alcohol use: none No drug use. Exercise: min active  She does get adequate calcium but not Vitamin D in her diet.  Still having issues with SUI, incontinence with walking, sitting and during sex without sensation to void. Saw Dr. Tiburcio Pea in past who suggested surgery. Pt ready for surgery, but Dr. Tiburcio Pea no longer here. Never did pelvic PT, was told to use ben wa balls but they're too painful for pt.  UROGYN ref and had transvag tape procedure  Labs with PCP  Doing therapy for  loss of parents, still grieving. Wearing heart monitor now for heart palpitations.   Past Medical History:  Diagnosis Date   Arthritis    bilateral hip and elbow pain   BRCA negative 06/2022   MyRisk neg   Centriacinar emphysema (HCC)    followed by pcp   Chronic headache    Family history of breast cancer    History of head injury 05/01/2019   evaluated by neurologist-- dr Terrace Arabia (note in epic 05-04-2019;  incident happened 05-01-2019 ziplining w/ helmet when stopped was jolted, acute onset left visual field disturbance/ left upper extremity weakness;   MRI brain/ orbits normal   History of pneumothorax 1988   w/  lung blebs  treated w/ chest tube   Hyperlipidemia    in epic nuclear stress test 05-15-2010  normal perfusion no ischemia, nuclear ef 70%   Increased risk of breast cancer 06/2022   IBIS=24.8%/riskscore=25.4%   SUI (stress urinary incontinence, female)    Wears dentures    full upper   Wears glasses     Past Surgical History:  Procedure Laterality Date   BLADDER SUSPENSION N/A 05/05/2023   Procedure: TRANSVAGINAL TAPE (TVT) PROCEDURE;  Surgeon: Marguerita Beards, MD;  Location: [facility];  Service: Gynecology;  Laterality: N/A;  Total time requested is 1 hour   CESAREAN SECTION     x2  last one 1992  with Bilateral Tubal Ligation  CYSTOSCOPY N/A 05/05/2023   Procedure: CYSTOSCOPY;  Surgeon: Marguerita Beards, MD;  Location: [facility];  Service: Gynecology;  Laterality: N/A;   ESOPHAGOGASTRODUODENOSCOPY  2013   @[facility]    LAPAROSCOPIC ASSISTED VAGINAL HYSTERECTOMY  2001   W/  BILATERAL SALPINGOOPHORECTOMY    Family History  Problem Relation Age of Onset   Diabetes Mother        DM Type 2   Stroke Mother    Breast cancer Mother 43       triple neg   Heart disease Mother    Hypertension Mother    Alzheimer's disease Mother    Heart disease Father    Hypertension Father    Diabetes Father        DM Type 2   Transient  ischemic attack Father    Breast cancer Paternal Grandmother        not sure of age   Breast cancer Maternal Aunt        86s   Lung cancer Cousin    Breast cancer Other    Breast cancer Paternal Aunt 16    Social History   Socioeconomic History   Marital status: Married    Spouse name: Not on file   Number of children: 2   Years of education: college   Highest education level: Not on file  Occupational History   Occupation: community navigator  Tobacco Use   Smoking status: Former    Current packs/day: 0.00    Average packs/day: 1 pack/day for 30.0 years (30.0 ttl pk-yrs)    Types: Cigarettes    Start date: 09/17/1969    Quit date: 09/18/1999    Years since quitting: 24.2   Smokeless tobacco: Never   Tobacco comments:    04-29-2023  per pt quit  2001, smoked for 30 yrs  Vaping Use   Vaping status: Never Used  Substance and Sexual Activity   Alcohol use: Yes    Comment: social   Drug use: Never   Sexual activity: Yes    Birth control/protection: Surgical, Post-menopausal    Comment: Hysterectomy  Other Topics Concern   Not on file  Social History Narrative   Lives at home with her husband.  Her parents live with her right now.  She is their caregiver.   Two cups coffee, one soda per day.   Right-handed.   Social Drivers of Corporate investment banker Strain: Low Risk  (06/13/2022)   Received from [facility]   Overall Financial Resource Strain (CARDIA)    Difficulty of Paying Living Expenses: Not hard at all  Food Insecurity: No Food Insecurity (10/21/2023)   [facility] from [facility]   Hunger Vital Sign    Worried About Running Out of Food in the Last Year: Never true    Ran Out of Food in the Last Year: Never true  Transportation Needs: No Transportation Needs (10/21/2023)   Received from [facility] - Transportation    Lack of Transportation (Medical): No    Lack of Transportation (Non-Medical): No  Physical Activity: Not  on file  Stress: No Stress Concern Present (07/18/2022)   Received from [facility], [facility]   [facility] - Occupational Stress Questionnaire    Feeling of Stress : Only a little  Social Connections: Not on file  Intimate Partner Violence: Not At Risk (07/18/2022)   Received from [facility]   Humiliation, Afraid, Rape, and Kick questionnaire    Fear of Current or Ex-Partner: No    Emotionally Abused: No    Physically Abused: No    Sexually Abused: No    No outpatient medications have been marked as taking for the 12/30/23 encounter (Appointment) with Axel Meas B, PA-C.     ROS:  Review of Systems  Constitutional:  Negative for fatigue, fever and unexpected weight change.  Respiratory:  Negative for cough, shortness of breath and wheezing.   Cardiovascular:  Positive for palpitations. Negative for chest pain and leg swelling.  Gastrointestinal:  Negative for blood in stool, constipation, diarrhea, nausea and vomiting.  Endocrine: Negative for cold intolerance, heat intolerance and polyuria.  Genitourinary:  Positive for dyspareunia. Negative for dysuria, flank pain, frequency, genital sores, hematuria, menstrual problem, pelvic pain, urgency, vaginal bleeding, vaginal discharge and vaginal pain.  Musculoskeletal:  Negative for back pain, joint swelling and myalgias.  Skin:  Negative for color change and rash.  Neurological:  Positive for headaches. Negative for dizziness, syncope, light-headedness and numbness.  Hematological:  Negative for adenopathy.  Psychiatric/Behavioral:  Positive for agitation and dysphoric mood. Negative for confusion, sleep disturbance and suicidal ideas. The patient is not nervous/anxious.      Objective: There were no vitals taken for this visit.   Physical Exam Constitutional:      Appearance: She is well-developed.  Genitourinary:     Vulva normal.     Genitourinary Comments:  UTERUS/CX SURG REM     Right Labia: No rash, tenderness or lesions.    Left Labia: No tenderness, lesions or rash.    Vaginal cuff intact.    No vaginal discharge, erythema or tenderness.      Right Adnexa: not tender and no mass present.    Left Adnexa: not tender and no mass present.    Cervix is absent.     Uterus is absent.  Breasts:    Right: No mass, nipple discharge, skin change or tenderness.     Left: No mass, nipple discharge, skin change or tenderness.  Neck:     Thyroid: No thyromegaly.  Cardiovascular:     Rate and Rhythm: Normal rate and regular rhythm.     Heart sounds: Normal heart sounds. No murmur heard. Pulmonary:     Effort: Pulmonary effort is normal.     Breath sounds: Normal breath sounds.  Abdominal:     Palpations: Abdomen is soft.     Tenderness: There is no abdominal tenderness. There is no guarding.  Musculoskeletal:        General: Normal range of motion.     Cervical back: Normal range of motion.  Neurological:     General: No focal deficit present.     Mental Status: She is alert and oriented to person, place, and time.     Cranial Nerves: No cranial nerve deficit.  Skin:    General: Skin is warm and dry.  Psychiatric:        Mood and Affect: Mood normal.        Behavior: Behavior normal.        Thought Content: Thought content normal.        Judgment: Judgment normal.  Vitals reviewed.     Assessment/Plan: Encounter for annual routine gynecological examination  Cervical cancer screening - Plan: Cytology - PAP  Encounter for screening mammogram for malignant neoplasm of breast; pt has appt  Family history of breast cancer - Plan:  [facility]); MyRisk testing discussed and done today. Will call with results.   Hormone replacement therapy (HRT) - Plan: estradiol-norethindrone (ACTIVELLA) 1-0.5 MG tablet; Rx RF. Pt still with night sweats but don't want to increase HRT dose. Pt wants to continue  HRT  Pilonidal cyst--can do gen surg ref prn. Not problematic now.   SUI (stress urinary incontinence, female) - Plan: Ambulatory referral to Urogynecology; refer to urogyn at [facility] per pt request for mgmt/tx.   Urinary incontinence without sensory awareness - Plan: Ambulatory referral to Urogynecology    No orders of the defined types were placed in this encounter.           GYN counsel breast self exam, mammography screening, adequate intake of calcium and vitamin D, diet and exercise    F/U  No follow-ups on file.  Maejor Erven B. Mehak Roskelley, PA-C 12/29/2023 4:34 PM

## 2023-12-30 ENCOUNTER — Ambulatory Visit: Admitting: Obstetrics and Gynecology

## 2023-12-30 ENCOUNTER — Encounter: Payer: Self-pay | Admitting: Obstetrics and Gynecology

## 2023-12-30 VITALS — BP 100/70 | Ht 67.0 in | Wt 151.0 lb

## 2023-12-30 DIAGNOSIS — Z1231 Encounter for screening mammogram for malignant neoplasm of breast: Secondary | ICD-10-CM

## 2023-12-30 DIAGNOSIS — Z79890 Hormone replacement therapy (postmenopausal): Secondary | ICD-10-CM

## 2023-12-30 DIAGNOSIS — N3942 Incontinence without sensory awareness: Secondary | ICD-10-CM

## 2023-12-30 DIAGNOSIS — N393 Stress incontinence (female) (male): Secondary | ICD-10-CM

## 2023-12-30 DIAGNOSIS — Z01419 Encounter for gynecological examination (general) (routine) without abnormal findings: Secondary | ICD-10-CM | POA: Diagnosis not present

## 2023-12-30 DIAGNOSIS — Z803 Family history of malignant neoplasm of breast: Secondary | ICD-10-CM

## 2023-12-30 DIAGNOSIS — Z9189 Other specified personal risk factors, not elsewhere classified: Secondary | ICD-10-CM

## 2023-12-30 DIAGNOSIS — Z1211 Encounter for screening for malignant neoplasm of colon: Secondary | ICD-10-CM

## 2023-12-30 MED ORDER — ESTRADIOL-NORETHINDRONE ACET 1-0.5 MG PO TABS: 1.0000 | ORAL_TABLET | Freq: Every day | ORAL | 3 refills | Status: AC

## 2023-12-30 NOTE — Patient Instructions (Signed)
 I value your feedback and you entrusting [place] with your care. If you get a [place] patient survey, I would appreciate you taking the time to let us know about your experience today. Thank you! ? ? ?

## 2024-03-15 ENCOUNTER — Other Ambulatory Visit: Payer: Self-pay | Admitting: Obstetrics and Gynecology

## 2024-03-15 DIAGNOSIS — Z79890 Hormone replacement therapy (postmenopausal): Secondary | ICD-10-CM

## 2024-05-05 ENCOUNTER — Other Ambulatory Visit: Payer: Self-pay | Admitting: Obstetrics and Gynecology

## 2024-05-05 DIAGNOSIS — Z79890 Hormone replacement therapy (postmenopausal): Secondary | ICD-10-CM
